# Patient Record
Sex: Female | Born: 1970 | Race: White | Hispanic: No | Smoking: Former smoker
Health system: Southern US, Community
[De-identification: ages and names within clinical notes are randomized; demographics above are authoritative.]

## PROBLEM LIST (undated history)

## (undated) DIAGNOSIS — Z9852 Vasectomy status: Secondary | ICD-10-CM

## (undated) DIAGNOSIS — F419 Anxiety disorder, unspecified: Secondary | ICD-10-CM

## (undated) HISTORY — DX: Anxiety disorder, unspecified: F41.9

## (undated) HISTORY — DX: Vasectomy status: Z98.52

---
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
## 2002-01-11 ENCOUNTER — Other Ambulatory Visit: Admission: RE | Admit: 2002-01-11 | Discharge: 2002-01-11 | Payer: Self-pay | Admitting: Gynecology

## 2002-09-21 ENCOUNTER — Other Ambulatory Visit: Admission: RE | Admit: 2002-09-21 | Discharge: 2002-09-21 | Payer: Self-pay | Admitting: Gynecology

## 2003-05-03 ENCOUNTER — Inpatient Hospital Stay (HOSPITAL_COMMUNITY): Admission: AD | Admit: 2003-05-03 | Discharge: 2003-05-04 | Payer: Self-pay | Admitting: Gynecology

## 2003-05-04 ENCOUNTER — Inpatient Hospital Stay (HOSPITAL_COMMUNITY): Admission: AD | Admit: 2003-05-04 | Discharge: 2003-05-06 | Payer: Self-pay | Admitting: Gynecology

## 2003-06-13 ENCOUNTER — Other Ambulatory Visit: Admission: RE | Admit: 2003-06-13 | Discharge: 2003-06-13 | Payer: Self-pay | Admitting: Gynecology

## 2004-06-17 ENCOUNTER — Other Ambulatory Visit: Admission: RE | Admit: 2004-06-17 | Discharge: 2004-06-17 | Payer: Self-pay | Admitting: Gynecology

## 2005-07-29 ENCOUNTER — Other Ambulatory Visit: Admission: RE | Admit: 2005-07-29 | Discharge: 2005-07-29 | Payer: Self-pay | Admitting: Gynecology

## 2006-07-30 ENCOUNTER — Other Ambulatory Visit: Admission: RE | Admit: 2006-07-30 | Discharge: 2006-07-30 | Payer: Self-pay | Admitting: Gynecology

## 2007-03-29 ENCOUNTER — Ambulatory Visit: Admission: RE | Admit: 2007-03-29 | Discharge: 2007-03-29 | Payer: Self-pay | Admitting: Obstetrics and Gynecology

## 2007-04-09 ENCOUNTER — Inpatient Hospital Stay (HOSPITAL_COMMUNITY): Admission: AD | Admit: 2007-04-09 | Discharge: 2007-04-09 | Payer: Self-pay | Admitting: Obstetrics and Gynecology

## 2007-06-13 ENCOUNTER — Inpatient Hospital Stay (HOSPITAL_COMMUNITY): Admission: RE | Admit: 2007-06-13 | Discharge: 2007-06-15 | Payer: Self-pay | Admitting: Obstetrics and Gynecology

## 2008-01-27 ENCOUNTER — Ambulatory Visit: Payer: Self-pay | Admitting: Family Medicine

## 2008-01-31 ENCOUNTER — Ambulatory Visit: Payer: Self-pay | Admitting: Family Medicine

## 2008-06-22 HISTORY — PX: AUGMENTATION MAMMAPLASTY: SUR837

## 2008-06-27 ENCOUNTER — Encounter: Payer: Self-pay | Admitting: Gynecology

## 2008-06-27 ENCOUNTER — Ambulatory Visit: Payer: Self-pay | Admitting: Gynecology

## 2008-06-27 ENCOUNTER — Other Ambulatory Visit: Admission: RE | Admit: 2008-06-27 | Discharge: 2008-06-27 | Payer: Self-pay | Admitting: Gynecology

## 2008-07-04 ENCOUNTER — Encounter: Payer: Self-pay | Admitting: Family Medicine

## 2008-09-02 ENCOUNTER — Encounter: Payer: Self-pay | Admitting: Family Medicine

## 2008-10-30 ENCOUNTER — Telehealth: Payer: Self-pay | Admitting: Family Medicine

## 2009-01-14 ENCOUNTER — Telehealth: Payer: Self-pay | Admitting: Family Medicine

## 2009-01-16 ENCOUNTER — Encounter: Payer: Self-pay | Admitting: Family Medicine

## 2009-01-28 ENCOUNTER — Ambulatory Visit: Payer: Self-pay | Admitting: Obstetrics and Gynecology

## 2009-01-28 ENCOUNTER — Inpatient Hospital Stay (HOSPITAL_COMMUNITY): Admission: AD | Admit: 2009-01-28 | Discharge: 2009-01-28 | Payer: Self-pay | Admitting: Obstetrics and Gynecology

## 2009-01-31 ENCOUNTER — Telehealth: Payer: Self-pay | Admitting: Family Medicine

## 2009-04-03 ENCOUNTER — Telehealth: Payer: Self-pay | Admitting: Family Medicine

## 2009-04-24 ENCOUNTER — Telehealth: Payer: Self-pay | Admitting: Family Medicine

## 2009-05-28 ENCOUNTER — Telehealth: Payer: Self-pay | Admitting: Family Medicine

## 2009-06-27 ENCOUNTER — Telehealth: Payer: Self-pay | Admitting: Family Medicine

## 2009-08-28 ENCOUNTER — Ambulatory Visit: Payer: Self-pay | Admitting: Gynecology

## 2009-08-28 ENCOUNTER — Other Ambulatory Visit: Admission: RE | Admit: 2009-08-28 | Discharge: 2009-08-28 | Payer: Self-pay | Admitting: Gynecology

## 2009-10-23 ENCOUNTER — Ambulatory Visit: Payer: Self-pay | Admitting: Family Medicine

## 2009-11-11 ENCOUNTER — Ambulatory Visit: Payer: Self-pay | Admitting: Gynecology

## 2009-11-19 ENCOUNTER — Ambulatory Visit: Payer: Self-pay | Admitting: Gynecology

## 2009-12-31 ENCOUNTER — Ambulatory Visit: Payer: Self-pay | Admitting: Gynecology

## 2010-02-12 ENCOUNTER — Ambulatory Visit: Payer: Self-pay | Admitting: Physician Assistant

## 2010-03-21 ENCOUNTER — Ambulatory Visit: Payer: Self-pay | Admitting: Gynecology

## 2010-04-09 ENCOUNTER — Ambulatory Visit: Payer: Self-pay | Admitting: Family Medicine

## 2010-06-09 ENCOUNTER — Ambulatory Visit: Payer: Self-pay | Admitting: Gynecology

## 2010-07-13 ENCOUNTER — Encounter: Payer: Self-pay | Admitting: Gynecology

## 2010-07-22 NOTE — Progress Notes (Signed)
  Phone Note Call from Patient   Caller: Patient Call For: Loreen Freud DO Reason for Call: Refill Medication Summary of Call: refill valium   Initial call taken by: Loreen Freud DO,  June 27, 2009 9:19 AM  Follow-up for Phone Call        ok to refill x1 pt will schedule cpe Follow-up by: Loreen Freud DO,  June 27, 2009 9:19 AM  Additional Follow-up for Phone Call Additional follow up Details #1::        [facility] [address]  Additional Follow-up by: Army Fossa CMA,  June 27, 2009 10:23 AM    Prescriptions: VALIUM 5 MG TABS (DIAZEPAM) 1 by mouth two times a day as needed  #60 x 0   Entered by:   Army Fossa CMA   Authorized by:   Loreen Freud DO   Signed by:   Army Fossa CMA on 06/27/2009   Method used:   Printed then faxed to ...       [facility]  [phone]* (retail)       [address] [place], [place]  [address]       Ph: [phone] or [phone]       Fax: [phone]   RxID:   [id number]

## 2010-09-27 LAB — CBC
HCT: 36.6 % (ref 36.0–46.0)
Hemoglobin: 12.7 g/dL (ref 12.0–15.0)
MCHC: 34.8 g/dL (ref 30.0–36.0)
MCV: 93.7 fL (ref 78.0–100.0)
Platelets: 232 10*3/uL (ref 150–400)

## 2010-09-27 LAB — URINALYSIS, ROUTINE W REFLEX MICROSCOPIC
Bilirubin Urine: NEGATIVE
Hgb urine dipstick: NEGATIVE
Ketones, ur: NEGATIVE mg/dL
Protein, ur: NEGATIVE mg/dL
Specific Gravity, Urine: 1.01 (ref 1.005–1.030)
Urobilinogen, UA: 0.2 mg/dL (ref 0.0–1.0)

## 2010-09-27 LAB — DIFFERENTIAL
Basophils Absolute: 0.1 10*3/uL (ref 0.0–0.1)
Basophils Relative: 1 % (ref 0–1)
Eosinophils Relative: 1 % (ref 0–5)
Lymphs Abs: 2.3 10*3/uL (ref 0.7–4.0)
Monocytes Absolute: 0.3 10*3/uL (ref 0.1–1.0)
Neutro Abs: 3.4 10*3/uL (ref 1.7–7.7)

## 2010-09-27 LAB — HCG, SERUM, QUALITATIVE: Preg, Serum: NEGATIVE

## 2010-11-04 NOTE — Consult Note (Signed)
NAME:  Diane Johnston, Diane Johnston NO.:  [id number]   MEDICAL RECORD NO.:  [id number]          PATIENT TYPE:  MAT   LOCATION:  MATC                          FACILITY:  [facility]   PHYSICIAN:  Daniel L. Gottsegen, M.D.DATE OF BIRTH:  12-10-1970   DATE OF CONSULTATION:  01/28/2009  DATE OF DISCHARGE:  01/28/2009                                 CONSULTATION   The patient is a 40 year old gravida 2, para 2, AB 0 who had come to the  emergency room today because of severe sudden onset of right lower  quadrant pain.  It has not been associated with change in her bowel  habits.  She is not nauseated or vomiting.  She is not having diarrhea,  but it was intense discomfort.  She contracepting with a mirena  IUD.  She has been a patient of our office for a long period of time.  The  pain was unremitting, when she got there.  I gave her some Toradol,  which has helped just a little bit so far.   PAST MEDICAL HISTORY:  The patient had 2 deliveries.   PRESENT MEDICATIONS:  Lexapro.   ALLERGIES:  She is allergic to PENICILLIN.   FAMILY HISTORY:  Her uncles with diabetic and her grandmother is  hypertensive.   SOCIAL HISTORY:  She is a drug representative.  She drinks alcohol  socially.  She is a nonsmoker.   REVIEW OF SYSTEMS:  HEENT:  Negative.  CARDIOVASCULAR:  Negative.  RESPIRATORY:  Negative.  GI:  See above.  GU:  Negative.  NEUROMUSCULAR:  Negative.   PHYSICAL EXAMINATION:  GENERAL:  The patient is a well-developed, well-  nourished female, in acute abdominal pain, lying on her side because of  the pain.  VITAL SIGNS:  Blood pressure is 130/80, pulse is 90, respiratory rate  are 16 and unlabored.  She is afebrile.  HEENT :  Within normal limits.  NECK:  Supple.  Trachea midline.  Thyroid not enlarged.  LUNGS, HEART, BREAST:  Not examined.  ABDOMEN:  She has an acute tenderness in the right lower quadrant.  There is significant guarding.  I would say there is no rebound but  there is significant guarding.  Bowel sounds are very hyperactive.  PELVIC:  External is normal.  BUS is normal.  Vaginal is normal.  Cervix  is clean.  IUD string is visible.  Uterus is normal size and shape.  Adnexa reveals some tenderness, but the tenderness almost appears to be  the lateral to the right adnexa.  Rectovaginal is confirmatory.   IMPRESSION:  Right lower quadrant pain of sudden onset of unknown  origin.   PLAN:  CBC, quantitative hCG, urine, pelvic ultrasound.   FINDINGS:  All studies came back normal, including her ultrasound.  During the time that she was here, getting her lab work done, and having  her ultrasound done, her pain absolutely diminished tremendously from of  10 to about 2.  I reexamined 3 hours later.  Her abdomen now is much  less tender.  I do not believe, we should proceed with a CT  scan at the moment.  She knows to come back immediately for CT scan of  her abdomen and pelvis to rule out appendicitis, if her pain starts to  get worse again.  She will call me in the morning to tell me, how she is  doing.  She will take ibuprofen as needed for pain at home.      Daniel L. Eda Paschal, M.D.  Electronically Signed     DLG/MEDQ  D:  01/28/2009  T:  01/29/2009  Job:  [id number]

## 2010-11-04 NOTE — Discharge Summary (Signed)
NAME:  Diane Johnston, Diane Johnston NO.:  [id number]   MEDICAL RECORD NO.:  [id number]          PATIENT TYPE:  INP   LOCATION:  9110                          [facility]ACILITY:  [facility]   PHYSICIAN:  Gerrit Friends. Aldona Bar, M.D.   DATE OF BIRTH:  [birth year]   DATE OF ADMISSION:  06/13/2007  DATE OF DISCHARGE:  06/15/2007                               DISCHARGE SUMMARY   DISCHARGE DIAGNOSES:  1. Term pregnancy delivered 7 pounds 4 ounces female infant, Apgars 9      and 9.  2. Blood type O negative - RhoGAM not needed.   PROCEDURES:  1. Induction of labor.  2. Normal spontaneous delivery.  3. Midline episiotomy and repair.   SUMMARY:  This 41 year old gravida 2, para 1 was admitted at [redacted] weeks  gestation for induction of labor at term by Dr. Henderson Cloud.  She was  admitted on the morning of 12/22 and progressed and subsequently had a  normal spontaneous delivery of a viable female infant weighing 7 pounds 4  ounces with Apgars of 8 and 9 over a second-degree episiotomy which was  repaired without difficulty.  The mother's postpartum course was benign.  Circumcision was done on the first postpartum day.  Discharge hemoglobin  was 10.8 with a white count of 14,100 and a platelet count of 188,000.  On the morning of 12/24, the patient was ambulating, tolerating a  regular diet well, was having normal bowel and bladder function, was  afebrile, was bottle feeding without difficulty (instructed), and was  anxious for discharge.  Accordingly, she was given all appropriate  instructions and understood all instructions well.  Discharge  medications include vitamins - one a day until gone, ferrous sulfate 325  mg one daily or every other day, Motrin 600 mg every 6 hours as needed  for cramping or pain, and Tylox 1-2 every 4-6 hours as needed for more  severe pain.  She will return to the office for followup in  approximately four weeks' time or as needed.   CONDITION ON DISCHARGE:  Improved.      Gerrit Friends. Aldona Bar, M.D.  Electronically Signed     RMW/MEDQ  D:  06/15/2007  T:  06/15/2007  Job:  [id number]

## 2010-11-07 NOTE — Discharge Summary (Signed)
NAME:  [name], Diane Johnston NO.:  [id number]   MEDICAL RECORD NO.:  [id number]                   PATIENT TYPE:  INP   LOCATION:  9123                                 [facility]ILITY[facility]   PHYSICIAN:  Ivor Costa. Farrel Gobble, M.D.              DATE OF BIRTH:  [birth year]   DATE OF ADMISSION:  05/04/2003  DATE OF DISCHARGE:  05/06/2003                                 DISCHARGE SUMMARY   DISCHARGE DIAGNOSES:  1. Intrauterine pregnancy 37 weeks, delivered.  2. 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  3. Status post spontaneous vaginal delivery.  4. Rh negative.   HISTORY:  This is a 40 year old female gravida 1, para 0 with an EDC of  May 23, 2003.  Prenatal course was complicated being Rh negative.  The  patient received RhoGAM in pregnancy.  Also was found to be a cystic  fibrosis carrier; however, patient was negative.   HOSPITAL COURSE:  On May 04, 2003 patient was admitted in labor at 37  weeks and subsequently underwent a spontaneous vaginal delivery on May 04, 2003 of a female, Apgars of 9 and 9, weight of 5 pounds 13 ounces.  There were no complications.  There was a first degree which was repaired.  Postpartum patient remained afebrile, voiding, stable condition.  She was  discharged to home May 06, 2003 and given [facility]  postpartum instruction.   ACCESSORY CLINICAL FINDINGS:  The patient is O-.  Rubella immune.  On  May 05, 2003 hemoglobin 12.1.   DISPOSITION:  The patient is discharged to home.  Informed to return six  weeks.  Any problem prior to that time to be seen in office.  Of note the  baby is also Rh negative.  Given prescription for Tylox p.r.n. pain #30.     Susa Loffler, P.A.                    Ivor Costa. Farrel Gobble, M.D.    TSG/MEDQ  D:  05/28/2003  T:  05/28/2003  Job:  [id number]

## 2010-11-07 NOTE — H&P (Signed)
NAME:  Diane Johnston, Diane Johnston NO.:  [id number]   MEDICAL RECORD NO.:  [id number]                   PATIENT TYPE:  INP   LOCATION:  9162                                 [facility]   PHYSICIAN:  Juan H. Lily Peer, M.D.             DATE OF BIRTH:  1971-03-10   DATE OF ADMISSION:  05/04/2003  DATE OF DISCHARGE:                                HISTORY & PHYSICAL   CHIEF COMPLAINT:  Contractions.   HISTORY OF PRESENT ILLNESS:  The patient is a 40 year old gravida 1, para 0  at 38 weeks estimated gestational age, with estimated date of confinement on  May 23, 2003.  She presented to [facility] today for the second  time in labor.  Earlier today she was in early labor and cervix was 1 cm and  80% with minus 3; reassuring tracing.  She was sent home with Ambien to  return back.  Her cervix is now 3 cm, 90%  minus 2.  The patient is in a lot  of discomfort due to her labor, with contractions every 3-4 min apart with a  reassuring heart rate tracing.   PRENATAL COURSE:  The patient is Rh negative, received RhoGAM.  Her husband  is Rh positive.  She is also a cystic fibrosis carrier and her husband was  tested and was negative.  The remainder of her prenatal course essentially  had been unremarkable (see [place] form).   PAST MEDICAL HISTORY:  1. The patient is a carrier for cystic fibrosis.  2. She has had a tonsillectomy at the age of [age].   ALLERGIES:  PENICILLIN.   REVIEW OF SYSTEMS:  See [place] form.   PHYSICAL EXAMINATION:  GENERAL:  Well developed, well nourished female.  HEENT:  Unremarkable.  NECK:  Supple.  Trachea midline.  No carotid bruits.  No thyromegaly.  LUNGS:  Lungs clear to auscultation without rhonchi or wheezes.  HEART:  Regular rate and rhythm; no murmurs or gallops.  BREASTS:  Examination not done.  ABDOMEN:  Gravid uterus.  Vertex presentation by [facility] maneuver.  Positive  fetal heart tones.  PELVIC:  Cervix 3+ cm,  90%, minus 2 station.  Intact membranes.  EXTREMITIES:  Deep tendon 1+.  Negative clonus.  Trace edema.   PRENATAL LABORATORIES:  Blood type 0 negative.  Negative antibody screen.  VDRL was nonreactive.  Hepatitis B surface antigen and HIV nonreactive.  Rubella immune.  Alpha-fetoprotein normal.  GBS culture negative.  Diabetes  screen normal.   ASSESSMENT:  A 40 year old gravida 1, para 0 at 37-1/2 weeks estimated  gestational age; in labor and advanced cervical dilatation.  Reassuring  fetal heart rate tracing.  Cystic fibrosis carrier.  Her husband tested,  negative carrier status.  Patient with negative GBS culture.  Contracting  every 3-4 min apart, with a reassuring fetal heart rate tracing.  She will  be admitted to labor and  delivery.  In Triage she will receive  1 mg Stadol with 12.5 mg Phenergan for her discomfort.  Once she is on the  Floor she will have her epidural, along with the routine labs.   PLAN:  As per assessment above.  Anticipate vaginal delivery.                                               Juan H. Lily Peer, M.D.    JHF/MEDQ  D:  05/04/2003  T:  05/04/2003  Job:  [id number]

## 2010-11-14 ENCOUNTER — Other Ambulatory Visit: Payer: Self-pay | Admitting: Family Medicine

## 2010-11-24 MED ORDER — ESCITALOPRAM OXALATE 10 MG PO TABS: 10.0000 mg | ORAL_TABLET | Freq: Every day | ORAL | Status: DC

## 2010-11-24 NOTE — Telephone Encounter (Signed)
She will need an appointment. I gave her a one-month supply.

## 2010-11-24 NOTE — Telephone Encounter (Deleted)
Have you done this?

## 2010-11-24 NOTE — Telephone Encounter (Deleted)
Hey

## 2010-11-24 NOTE — Telephone Encounter (Deleted)
See prior mess

## 2010-12-23 ENCOUNTER — Other Ambulatory Visit: Payer: Self-pay | Admitting: Gynecology

## 2010-12-23 DIAGNOSIS — Z1231 Encounter for screening mammogram for malignant neoplasm of breast: Secondary | ICD-10-CM

## 2010-12-29 ENCOUNTER — Encounter: Payer: Self-pay | Admitting: Gynecology

## 2010-12-31 ENCOUNTER — Ambulatory Visit: Payer: Self-pay

## 2011-01-06 ENCOUNTER — Other Ambulatory Visit: Payer: Self-pay | Admitting: Gynecology

## 2011-01-06 ENCOUNTER — Encounter (INDEPENDENT_AMBULATORY_CARE_PROVIDER_SITE_OTHER): Payer: BC Managed Care – PPO | Admitting: Gynecology

## 2011-01-06 ENCOUNTER — Other Ambulatory Visit (HOSPITAL_COMMUNITY)
Admission: RE | Admit: 2011-01-06 | Discharge: 2011-01-06 | Disposition: A | Discharge status: Home / Self Care | Payer: BC Managed Care – PPO | Source: Ambulatory Visit | Attending: Gynecology | Admitting: Gynecology

## 2011-01-06 DIAGNOSIS — B373 Candidiasis of vulva and vagina: Secondary | ICD-10-CM

## 2011-01-06 DIAGNOSIS — Z01419 Encounter for gynecological examination (general) (routine) without abnormal findings: Secondary | ICD-10-CM

## 2011-01-06 DIAGNOSIS — Z124 Encounter for screening for malignant neoplasm of cervix: Secondary | ICD-10-CM | POA: Insufficient documentation

## 2011-01-06 DIAGNOSIS — R634 Abnormal weight loss: Secondary | ICD-10-CM

## 2011-03-27 LAB — CBC
HCT: 34 — ABNORMAL LOW
Hemoglobin: 10.8 — ABNORMAL LOW
Hemoglobin: 11.8 — ABNORMAL LOW
MCHC: 35
MCV: 92.1
RBC: 3.35 — ABNORMAL LOW
RDW: 13.6
WBC: 9.1

## 2011-04-01 LAB — URINALYSIS, ROUTINE W REFLEX MICROSCOPIC
Bilirubin Urine: NEGATIVE
Hgb urine dipstick: NEGATIVE
Specific Gravity, Urine: <1.005 — ABNORMAL LOW
pH: 6

## 2011-04-02 LAB — RH IMMUNE GLOBULIN WORKUP (NOT WOMEN'S HOSP): Antibody Screen: NEGATIVE

## 2011-04-30 ENCOUNTER — Ambulatory Visit: Payer: BC Managed Care – PPO

## 2011-05-27 ENCOUNTER — Ambulatory Visit
Admission: RE | Admit: 2011-05-27 | Discharge: 2011-05-27 | Disposition: A | Discharge status: Home / Self Care | Payer: BC Managed Care – PPO | Source: Ambulatory Visit | Attending: Gynecology | Admitting: Gynecology

## 2011-05-27 DIAGNOSIS — Z1231 Encounter for screening mammogram for malignant neoplasm of breast: Secondary | ICD-10-CM

## 2011-10-12 ENCOUNTER — Other Ambulatory Visit: Payer: Self-pay | Admitting: *Deleted

## 2011-10-12 MED ORDER — ALPRAZOLAM 0.25 MG PO TABS: 0.2500 mg | ORAL_TABLET | Freq: Every evening | ORAL | Status: AC | PRN

## 2011-10-12 NOTE — Telephone Encounter (Signed)
rx called in

## 2011-11-13 ENCOUNTER — Other Ambulatory Visit: Payer: Self-pay | Admitting: *Deleted

## 2011-11-13 MED ORDER — ALPRAZOLAM 0.25 MG PO TABS: 0.2500 mg | ORAL_TABLET | Freq: Every evening | ORAL | Status: DC | PRN

## 2012-01-11 ENCOUNTER — Other Ambulatory Visit: Payer: Self-pay | Admitting: Women's Health

## 2012-01-11 MED ORDER — ALPRAZOLAM 0.25 MG PO TABS: 0.2500 mg | ORAL_TABLET | Freq: Every evening | ORAL | Status: DC | PRN

## 2012-01-12 NOTE — Telephone Encounter (Signed)
Called into pharmacy

## 2012-02-04 ENCOUNTER — Other Ambulatory Visit: Payer: Self-pay | Admitting: Women's Health

## 2012-02-04 NOTE — Telephone Encounter (Signed)
Yes, please refill Lexapro prescription. thank you for having her schedule annual exam.

## 2012-02-04 NOTE — Telephone Encounter (Signed)
Diane Johnston, patient's last CE was 01/07/11. Lupita Leash is going to call her to schedule CE for her.  Do you want to refill her Rx above?  Hardcopy chart is on your desk.

## 2012-03-31 ENCOUNTER — Other Ambulatory Visit: Payer: Self-pay | Admitting: Women's Health

## 2012-03-31 NOTE — Telephone Encounter (Signed)
Has AEX with JF on 04/07/12.

## 2012-03-31 NOTE — Telephone Encounter (Signed)
Please call patient and have her schedule annual exam with Dr. Lily Peer, last exam was July 2012. Okay for  Rx with no other refills

## 2012-04-07 ENCOUNTER — Encounter: Payer: BC Managed Care – PPO | Admitting: Gynecology

## 2012-04-12 ENCOUNTER — Encounter: Payer: BC Managed Care – PPO | Admitting: Gynecology

## 2012-04-18 ENCOUNTER — Encounter: Payer: Self-pay | Admitting: Gynecology

## 2012-04-18 ENCOUNTER — Ambulatory Visit (INDEPENDENT_AMBULATORY_CARE_PROVIDER_SITE_OTHER): Payer: BC Managed Care – PPO | Admitting: Gynecology

## 2012-04-18 VITALS — BP 110/68 | Ht 65.75 in | Wt 116.0 lb

## 2012-04-18 DIAGNOSIS — Z23 Encounter for immunization: Secondary | ICD-10-CM

## 2012-04-18 DIAGNOSIS — Z01419 Encounter for gynecological examination (general) (routine) without abnormal findings: Secondary | ICD-10-CM

## 2012-04-18 DIAGNOSIS — A6000 Herpesviral infection of urogenital system, unspecified: Secondary | ICD-10-CM

## 2012-04-18 LAB — CBC WITH DIFFERENTIAL/PLATELET
Basophils Absolute: 0 10*3/uL (ref 0.0–0.1)
Basophils Relative: 1 % (ref 0–1)
Eosinophils Absolute: 0.1 10*3/uL (ref 0.0–0.7)
HCT: 37.3 % (ref 36.0–46.0)
MCHC: 34.3 g/dL (ref 30.0–36.0)
Monocytes Absolute: 0.4 10*3/uL (ref 0.1–1.0)
Neutro Abs: 3.6 10*3/uL (ref 1.7–7.7)
Neutrophils Relative %: 62 % (ref 43–77)
RDW: 13.1 % (ref 11.5–15.5)

## 2012-04-18 LAB — GLUCOSE, RANDOM: Glucose, Bld: 78 mg/dL (ref 70–99)

## 2012-04-18 NOTE — Progress Notes (Signed)
Diane Johnston 1970/10/26 [id number]   History:    41 y.o.  for annual gyn exam with no complaints today. Patient is having normal menstrual cycle. Patient no longer taking Lexapro. Patient's husband has had a vasectomy. Patient does her monthly self breast examination. Her last mammogram was in 2012 was normal she has one scheduled the next few weeks. She does not recall and there had received a dTap Vaccine.  Past medical history,surgical history, family history and social history were all reviewed and documented in the EPIC chart.  Gynecologic History Patient's last menstrual period was 04/04/2012. Contraception: vasectomy Last Pap: 2012. Results were: normal Last mammogram: 2012. Results were: normal  Obstetric History OB History    Grav Para Term Preterm Abortions TAB SAB Ect Mult Living   2 2 2       2      # Outc Date GA Lbr Len/2nd Wgt Sex Del Anes PTL Lv   1 TRM     F SVD  No Yes   2 TRM     M SVD  No Yes       ROS: A ROS was performed and pertinent positives and negatives are included in the history.  GENERAL: No fevers or chills. HEENT: No change in vision, no earache, sore throat or sinus congestion. NECK: No pain or stiffness. CARDIOVASCULAR: No chest pain or pressure. No palpitations. PULMONARY: No shortness of breath, cough or wheeze. GASTROINTESTINAL: No abdominal pain, nausea, vomiting or diarrhea, melena or bright red blood per rectum. GENITOURINARY: No urinary frequency, urgency, hesitancy or dysuria. MUSCULOSKELETAL: No joint or muscle pain, no back pain, no recent trauma. DERMATOLOGIC: No rash, no itching, no lesions. ENDOCRINE: No polyuria, polydipsia, no heat or cold intolerance. No recent change in weight. HEMATOLOGICAL: No anemia or easy bruising or bleeding. NEUROLOGIC: No headache, seizures, numbness, tingling or weakness. PSYCHIATRIC: No depression, no loss of interest in normal activity or change in sleep pattern.     Exam: chaperone present  BP 110/68  Ht 5'  5.75" (1.67 m)  Wt 116 lb (52.617 kg)  BMI 18.87 kg/m2  LMP 04/04/2012  Body mass index is 18.87 kg/(m^2).  General appearance : Well developed well nourished female. No acute distress HEENT: Neck supple, trachea midline, no carotid bruits, no thyroidmegaly Lungs: Clear to auscultation, no rhonchi or wheezes, or rib retractions  Heart: Regular rate and rhythm, no murmurs or gallops Breast:Examined in sitting and supine position were symmetrical in appearance, no palpable masses or tenderness,  no skin retraction, no nipple inversion, no nipple discharge, no skin discoloration, no axillary or supraclavicular lymphadenopathy Abdomen: no palpable masses or tenderness, no rebound or guarding Extremities: no edema or skin discoloration or tenderness  Pelvic:  Bartholin, Urethra, Skene Glands: Within normal limits             Vagina: No gross lesions or discharge  Cervix: No gross lesions or discharge  Uterus  anteverted, normal size, shape and consistency, non-tender and mobile  Adnexa  Without masses or tenderness  Anus and perineum  normal   Rectovaginal  normal sphincter tone without palpated masses or tenderness             Hemoccult not done     Assessment/Plan:  41 y.o. female for annual exam who was to receive her dTap Vaccine today. The following labs were ordered today: Fasting lipid profile, fasting blood sugar, TSH, CBC and urinalysis. We discussed the new Pap smear screening guidelines and no Pap smear done  today. She was encouraged to do her monthly self breast examination and to followup with her mammogram.    Ok Edwards MD, 12:47 PM 04/18/2012

## 2012-04-18 NOTE — Addendum Note (Signed)
Addended by: Bertram Savin A on: 04/18/2012 12:53 PM   Modules accepted: Orders

## 2012-04-18 NOTE — Patient Instructions (Addendum)

## 2012-04-19 ENCOUNTER — Other Ambulatory Visit: Payer: Self-pay | Admitting: Gynecology

## 2012-04-19 DIAGNOSIS — E7800 Pure hypercholesterolemia, unspecified: Secondary | ICD-10-CM

## 2012-04-19 LAB — URINALYSIS W MICROSCOPIC + REFLEX CULTURE
Bacteria, UA: NONE SEEN
Bilirubin Urine: NEGATIVE
Casts: NONE SEEN
Crystals: NONE SEEN
Glucose, UA: NEGATIVE mg/dL
Hgb urine dipstick: NEGATIVE
Ketones, ur: NEGATIVE mg/dL
Leukocytes, UA: NEGATIVE
Nitrite: NEGATIVE
Protein, ur: NEGATIVE mg/dL
Specific Gravity, Urine: 1.007 (ref 1.005–1.030)
Urobilinogen, UA: 0.2 mg/dL (ref 0.0–1.0)
pH: 7.5 (ref 5.0–8.0)

## 2012-05-01 ENCOUNTER — Other Ambulatory Visit: Payer: Self-pay | Admitting: Women's Health

## 2012-05-02 NOTE — Telephone Encounter (Signed)
Pt just had AEX with Dr Lily Peer.  [place] said OK to the prescribed but to let Dr Glenetta Hew know.

## 2012-06-25 ENCOUNTER — Emergency Department (HOSPITAL_COMMUNITY): Payer: BC Managed Care – PPO

## 2012-06-25 ENCOUNTER — Emergency Department (HOSPITAL_COMMUNITY)
Admission: EM | Admit: 2012-06-25 | Discharge: 2012-06-25 | Disposition: A | Discharge status: Home / Self Care | Payer: BC Managed Care – PPO | Attending: Emergency Medicine | Admitting: Emergency Medicine

## 2012-06-25 ENCOUNTER — Encounter (HOSPITAL_COMMUNITY): Payer: Self-pay | Admitting: Emergency Medicine

## 2012-06-25 ENCOUNTER — Other Ambulatory Visit: Payer: Self-pay | Admitting: Gynecology

## 2012-06-25 DIAGNOSIS — R55 Syncope and collapse: Secondary | ICD-10-CM | POA: Insufficient documentation

## 2012-06-25 DIAGNOSIS — Z8619 Personal history of other infectious and parasitic diseases: Secondary | ICD-10-CM | POA: Insufficient documentation

## 2012-06-25 DIAGNOSIS — R5381 Other malaise: Secondary | ICD-10-CM | POA: Insufficient documentation

## 2012-06-25 DIAGNOSIS — F17200 Nicotine dependence, unspecified, uncomplicated: Secondary | ICD-10-CM | POA: Insufficient documentation

## 2012-06-25 DIAGNOSIS — Z79899 Other long term (current) drug therapy: Secondary | ICD-10-CM | POA: Insufficient documentation

## 2012-06-25 DIAGNOSIS — F411 Generalized anxiety disorder: Secondary | ICD-10-CM | POA: Insufficient documentation

## 2012-06-25 DIAGNOSIS — I951 Orthostatic hypotension: Secondary | ICD-10-CM | POA: Insufficient documentation

## 2012-06-25 LAB — COMPREHENSIVE METABOLIC PANEL
ALT: 8 U/L (ref 0–35)
AST: 17 U/L (ref 0–37)
Albumin: 3.8 g/dL (ref 3.5–5.2)
Alkaline Phosphatase: 53 U/L (ref 39–117)
Glucose, Bld: 83 mg/dL (ref 70–99)
Potassium: 4 mEq/L (ref 3.5–5.1)
Sodium: 136 mEq/L (ref 135–145)
Total Protein: 6.4 g/dL (ref 6.0–8.3)

## 2012-06-25 LAB — CBC WITH DIFFERENTIAL/PLATELET
Basophils Absolute: 0 10*3/uL (ref 0.0–0.1)
Basophils Relative: 0 % (ref 0–1)
Eosinophils Absolute: 0.2 10*3/uL (ref 0.0–0.7)
Lymphs Abs: 2 10*3/uL (ref 0.7–4.0)
MCH: 30.9 pg (ref 26.0–34.0)
Neutrophils Relative %: 73 % (ref 43–77)
Platelets: 327 10*3/uL (ref 150–400)
RBC: 4.04 MIL/uL (ref 3.87–5.11)
RDW: 12.6 % (ref 11.5–15.5)

## 2012-06-25 MED ORDER — SODIUM CHLORIDE 0.9 % IV SOLN: Freq: Once | INTRAVENOUS | Status: DC

## 2012-06-25 NOTE — ED Notes (Signed)
Reports noted while driving & not driving feels like she is zoning out, & going to pass out, hear ringing in ears when tunnel vision occurs. This has been going on since November worse this pass week. Coming from family MD for a CT scan & thyroid work-up.  General appearance is a well no acute distress patient who has no neuro deficits, embraced of her pedicure.

## 2012-06-25 NOTE — ED Notes (Signed)
Patient c/o generalized weakness, dizziness, and near syncopal episodes while driving.  Patient has h/o orthostatic hypotension.  Patient was seen at an Urgent Care and sent here for follow up.

## 2012-06-25 NOTE — ED Provider Notes (Signed)
History     CSN: [id number]  Arrival date & time 06/25/12  1247   First MD Initiated Contact with Patient 06/25/12 1320      Chief Complaint  Patient presents with  . Fatigue  . Near Syncope    (Consider location/radiation/quality/duration/timing/severity/associated sxs/prior treatment) HPI Comments: The patient presents with complaints of weakness for the past several weeks.  She feels faint and light-headed on occasion as if she is going to pass out.  This has occurred while driving and while at rest at home.  She denies any chest pain or shortness of breath.  No n/v/d.  She was told at one time that she had orthostatic hypotension.    The history is provided by the patient.    Past Medical History  Diagnosis Date  . Anxiety   . HSV-2 (herpes simplex virus 2) infection   . STD (sexually transmitted disease)     HSV 2  . H/O: vasectomy     HUSBAND HAS HAD VASECTOMY    Past Surgical History  Procedure Date  . Tonsillectomy and adenoidectomy 1980  . Augmentation mammaplasty 2010    SILICONE    Family History  Problem Relation Age of Onset  . Osteoporosis Maternal Grandmother   . Hypertension Paternal Grandmother   . Cancer Paternal Grandmother     OVARIAN CANCER    History  Substance Use Topics  . Smoking status: Current Some Day Smoker    Types: Cigarettes  . Smokeless tobacco: Never Used  . Alcohol Use: Yes    OB History    Grav Para Term Preterm Abortions TAB SAB Ect Mult Living   2 2 2       2       Review of Systems  All other systems reviewed and are negative.    Allergies  Penicillins  Home Medications   Current Outpatient Rx  Name  Route  Sig  Dispense  Refill  . ALPRAZOLAM 0.25 MG PO TABS   Oral   Take 0.25 mg by mouth at bedtime as needed. Anxiety         . IBUPROFEN 200 MG PO TABS   Oral   Take 200 mg by mouth every 6 (six) hours as needed. Pain         . MULTIVITAMINS PO CAPS   Oral   Take 1 capsule by mouth daily.             BP 109/94  Pulse 80  Temp 98 F (36.7 C) (Oral)  Resp 20  Ht 5\' 6"  (1.676 m)  Wt 120 lb (54.432 kg)  BMI 19.37 kg/m2  SpO2 100%  LMP 06/22/2012  Physical Exam  Nursing note and vitals reviewed. Constitutional: She is oriented to person, place, and time. She appears well-developed and well-nourished. No distress.  HENT:  Head: Normocephalic and atraumatic.  Mouth/Throat: Oropharynx is clear and moist.  Eyes: EOM are normal. Pupils are equal, round, and reactive to light.  Neck: Normal range of motion. Neck supple.  Cardiovascular: Normal rate and regular rhythm.  Exam reveals no gallop and no friction rub.   No murmur heard. Pulmonary/Chest: Effort normal and breath sounds normal. No respiratory distress. She has no wheezes.  Abdominal: Soft. Bowel sounds are normal. She exhibits no distension. There is no tenderness.  Musculoskeletal: Normal range of motion.  Neurological: She is alert and oriented to person, place, and time. No cranial nerve deficit. She exhibits normal muscle tone. Coordination normal.  Skin: Skin  is warm and dry. She is not diaphoretic.    ED Course  Procedures (including critical care time)   Labs Reviewed  CBC WITH DIFFERENTIAL  COMPREHENSIVE METABOLIC PANEL  TROPONIN I  TSH   Ct Head Wo Contrast  06/25/2012  *RADIOLOGY REPORT*  Clinical Data:  Near syncope  CT HEAD WITHOUT CONTRAST  Technique:  Contiguous axial images were obtained from the base of the skull through the vertex without contrast  Comparison:  None.  Findings:  The brain has a normal appearance without evidence for hemorrhage, acute infarction, hydrocephalus, or mass lesion.  There is no extra axial fluid collection.  The skull and paranasal sinuses are normal.  IMPRESSION: Normal CT of the head without contrast.   Original Report Authenticated By: Janeece Riggers, M.D.      No diagnosis found.   Date: 06/25/2012  Rate: 57  Rhythm: sinus bradycardia  QRS Axis: normal   Intervals: normal  ST/T Wave abnormalities: normal  Conduction Disutrbances:none  Narrative Interpretation:   Old EKG Reviewed: none available    MDM  The patient presents with episodic episodes of near-syncope/dizziness that have been occurring for weeks.  She was seen at [facility] and sent here for further workup.  The labs, ekg, and ct of the head are all unremarkable.  At this point I do not have an explanation for these symptoms.  There does not appear to be an emergent cause and she appears very stable for discharge.  She will be discharged, to return prn for any problems.  I will also encourage follow up with her primary provider.        Geoffery Lyons, MD 06/25/12 [phone]

## 2012-06-27 NOTE — Telephone Encounter (Signed)
rx called in JW

## 2012-09-07 ENCOUNTER — Emergency Department (HOSPITAL_COMMUNITY)
Admission: EM | Admit: 2012-09-07 | Discharge: 2012-09-07 | Discharge status: Against Medical Advice | Payer: BC Managed Care – PPO | Attending: Emergency Medicine | Admitting: Emergency Medicine

## 2012-09-07 ENCOUNTER — Encounter (HOSPITAL_COMMUNITY): Payer: Self-pay | Admitting: *Deleted

## 2012-09-07 DIAGNOSIS — Z87891 Personal history of nicotine dependence: Secondary | ICD-10-CM | POA: Insufficient documentation

## 2012-09-07 DIAGNOSIS — F411 Generalized anxiety disorder: Secondary | ICD-10-CM | POA: Insufficient documentation

## 2012-09-07 LAB — POCT I-STAT TROPONIN I: Troponin i, poc: 0.01 ng/mL (ref 0.00–0.08)

## 2012-09-07 NOTE — ED Notes (Signed)
Called x 3 in lobby, no answer 

## 2012-09-07 NOTE — ED Notes (Signed)
Pt is under a lot of stress at home/work and feels like her body is "shutting down".  She feels as if her heart is racing (hr 93), like her throat is tight.  She feels as if she is having a heart attack.

## 2012-09-28 ENCOUNTER — Telehealth: Payer: Self-pay | Admitting: *Deleted

## 2012-09-28 NOTE — Telephone Encounter (Signed)
Pt currently taking xanax 0.25 mg , pt called requesting if she could have her xanax refill at earlier date.[facility] said that [facility] was [facility] filled on 09/08/12. Pt just received a new job promotion and has had stress, she will be leaving town today and asked if this could be done? Please advise

## 2012-09-28 NOTE — Telephone Encounter (Signed)
Robin (pharmacist) said that the patient does have a refill left, her last refill was on March 30 th, the patient would like a early refill because of leaving town. She did receive # 30 pill on the march 20 th rx.  The pharmacy will need your approval if its okay to fill the xanax early. Please advise

## 2012-09-28 NOTE — Telephone Encounter (Signed)
Please call in for 30 tablets since she is going on a trip. But will need to monitor

## 2012-09-28 NOTE — Telephone Encounter (Signed)
Spoke with [facility] at [facility] [address] and told him per JF okay to fill xanax 0.25 mg # 30.

## 2012-09-28 NOTE — Telephone Encounter (Signed)
Please clarify if she had a refill on March 20th were there no refills? She should have gotten 30?

## 2012-10-24 ENCOUNTER — Telehealth: Payer: Self-pay | Admitting: Gynecology

## 2012-10-24 ENCOUNTER — Other Ambulatory Visit: Payer: Self-pay | Admitting: Gynecology

## 2012-10-24 DIAGNOSIS — Z3049 Encounter for surveillance of other contraceptives: Secondary | ICD-10-CM

## 2012-10-24 MED ORDER — LEVONORGESTREL 20 MCG/24HR IU IUD: INTRAUTERINE_SYSTEM | Freq: Once | INTRAUTERINE | Status: DC

## 2012-10-24 NOTE — Telephone Encounter (Signed)
Pt was advised today that her [employer] ins covers the Mirena & insertion at 100%. She made appt with JF for 10/31/12.WL

## 2012-10-31 ENCOUNTER — Ambulatory Visit (INDEPENDENT_AMBULATORY_CARE_PROVIDER_SITE_OTHER): Payer: BC Managed Care – PPO | Admitting: Gynecology

## 2012-10-31 ENCOUNTER — Encounter: Payer: Self-pay | Admitting: Gynecology

## 2012-10-31 VITALS — BP 112/70

## 2012-10-31 DIAGNOSIS — Z30430 Encounter for insertion of intrauterine contraceptive device: Secondary | ICD-10-CM | POA: Insufficient documentation

## 2012-10-31 NOTE — Progress Notes (Signed)
Patient is a 42 year old who was seen the office on October 2013 for annual gynecological examination. Patient's husband has had vasectomy and several years ago she discontinued a Mirena IUD. She had done well with a Mirena IUD for cycle control is here to have the Mirena IUD placed as a result of her heavy periods. Patient did well with a Mirena IUD in the past. Literature formation been provided as well. Patient fully where the risks benefits and pros and cons. Patient fully where this form of contraception is 99% effective and is good for 5 years.                                   IUD procedure note       Patient presented to the office today for placement of Mirena IUD. The patient had previously been provided with literature information on this method of contraception. The risks benefits and pros and cons were discussed and all her questions were answered. She is fully aware that this form of contraception is 99% effective and is good for 5 years.  Pelvic exam: Bartholin urethra Skene glands: Within normal limits Vagina: No lesions or discharge Cervix: No lesions or discharge Uterus: anteverted position Adnexa: No masses or tenderness Rectal exam: Not done  The cervix was cleansed with Betadine solution. A single-tooth tenaculum was placed on the anterior cervical lip. The uterus sounded to 7-1/2 centimeter. The IUD was shown to the patient and inserted in a sterile fashion. The IUD string was trimmed. The single-tooth tenaculum was removed. Patient was instructed to return back to the office in one month for follow up.

## 2012-10-31 NOTE — Patient Instructions (Addendum)
Intrauterine Device Information  An intrauterine device (IUD) is inserted into your uterus and prevents pregnancy. There are 2 types of IUDs available:  · Copper IUD. This type of IUD is wrapped in copper wire and is placed inside the uterus. Copper makes the uterus and fallopian tubes produce a fluid that kills sperm. The copper IUD can stay in place for 10 years.  · Hormone IUD. This type of IUD contains the hormone progestin (synthetic progesterone). The hormone thickens the cervical mucus and prevents sperm from entering the uterus, and it also thins the uterine lining to prevent implantation of a fertilized egg. The hormone can weaken or kill the sperm that get into the uterus. The hormone IUD can stay in place for 5 years.  Your caregiver will make sure you are a good candidate for a contraceptive IUD. Discuss with your caregiver the possible side effects.  ADVANTAGES  · It is highly effective, reversible, long-acting, and low maintenance.  · There are no estrogen-related side effects.  · An IUD can be used when breastfeeding.  · It is not associated with weight gain.  · It works immediately after insertion.  · The copper IUD does not interfere with your female hormones.  · The progesterone IUD can make heavy menstrual periods lighter.  · The progesterone IUD can be used for 5 years.  · The copper IUD can be used for 10 years.  DISADVANTAGES  · The progesterone IUD can be associated with irregular bleeding patterns.  · The copper IUD can make your menstrual flow heavier and more painful.  · You may experience cramping and vaginal bleeding after insertion.  Document Released: 05/12/2004 Document Revised: 08/31/2011 Document Reviewed: 10/11/2010  Exit[employer]® Patient Information ©2013 [employer].

## 2013-03-27 ENCOUNTER — Encounter: Payer: BC Managed Care – PPO | Admitting: Gynecology

## 2013-10-30 ENCOUNTER — Other Ambulatory Visit: Payer: Self-pay | Admitting: Family Medicine

## 2013-10-30 DIAGNOSIS — F411 Generalized anxiety disorder: Secondary | ICD-10-CM

## 2013-10-30 MED ORDER — ALPRAZOLAM 0.25 MG PO TABS: 0.2500 mg | ORAL_TABLET | Freq: Three times a day (TID) | ORAL | Status: DC | PRN

## 2013-12-14 ENCOUNTER — Other Ambulatory Visit: Payer: Self-pay | Admitting: Family Medicine

## 2013-12-14 DIAGNOSIS — F4323 Adjustment disorder with mixed anxiety and depressed mood: Secondary | ICD-10-CM

## 2013-12-14 MED ORDER — ESCITALOPRAM OXALATE 10 MG PO TABS: 10.0000 mg | ORAL_TABLET | Freq: Every day | ORAL | Status: DC

## 2014-04-23 ENCOUNTER — Encounter: Payer: Self-pay | Admitting: Gynecology

## 2014-06-18 ENCOUNTER — Other Ambulatory Visit: Payer: Self-pay

## 2014-06-18 ENCOUNTER — Other Ambulatory Visit: Payer: Self-pay | Admitting: Family Medicine

## 2014-06-18 DIAGNOSIS — F4323 Adjustment disorder with mixed anxiety and depressed mood: Secondary | ICD-10-CM

## 2014-06-18 MED ORDER — ESCITALOPRAM OXALATE 10 MG PO TABS: 10.0000 mg | ORAL_TABLET | Freq: Every day | ORAL | Status: DC

## 2014-06-18 NOTE — Telephone Encounter (Signed)
Patient has not been seen since 2011 and the med's were filled 12/14/13 #30 with 5 refills. please advise     KP

## 2015-01-07 ENCOUNTER — Other Ambulatory Visit: Payer: Self-pay | Admitting: Family Medicine

## 2015-01-07 DIAGNOSIS — F411 Generalized anxiety disorder: Secondary | ICD-10-CM

## 2015-01-07 MED ORDER — ALPRAZOLAM 0.25 MG PO TABS: 0.2500 mg | ORAL_TABLET | Freq: Three times a day (TID) | ORAL | Status: DC | PRN

## 2015-01-10 ENCOUNTER — Other Ambulatory Visit: Payer: Self-pay | Admitting: Family Medicine

## 2015-01-10 DIAGNOSIS — F411 Generalized anxiety disorder: Secondary | ICD-10-CM

## 2015-01-10 MED ORDER — ALPRAZOLAM 0.25 MG PO TABS: 0.2500 mg | ORAL_TABLET | Freq: Three times a day (TID) | ORAL | Status: DC | PRN

## 2016-05-22 DIAGNOSIS — R350 Frequency of micturition: Secondary | ICD-10-CM | POA: Diagnosis not present

## 2016-05-28 DIAGNOSIS — O26891 Other specified pregnancy related conditions, first trimester: Secondary | ICD-10-CM | POA: Diagnosis not present

## 2016-05-28 DIAGNOSIS — Z3201 Encounter for pregnancy test, result positive: Secondary | ICD-10-CM | POA: Diagnosis not present

## 2016-05-28 DIAGNOSIS — N912 Amenorrhea, unspecified: Secondary | ICD-10-CM | POA: Diagnosis not present

## 2016-05-28 DIAGNOSIS — Z3A01 Less than 8 weeks gestation of pregnancy: Secondary | ICD-10-CM | POA: Diagnosis not present

## 2016-06-14 ENCOUNTER — Encounter (HOSPITAL_COMMUNITY): Payer: Self-pay | Admitting: *Deleted

## 2016-06-14 ENCOUNTER — Inpatient Hospital Stay (HOSPITAL_COMMUNITY): Payer: BLUE CROSS/BLUE SHIELD

## 2016-06-14 ENCOUNTER — Inpatient Hospital Stay (HOSPITAL_COMMUNITY)
Admission: AD | Admit: 2016-06-14 | Discharge: 2016-06-14 | Disposition: A | Discharge status: Home / Self Care | Payer: BLUE CROSS/BLUE SHIELD | Source: Ambulatory Visit | Attending: Obstetrics and Gynecology | Admitting: Obstetrics and Gynecology

## 2016-06-14 DIAGNOSIS — O209 Hemorrhage in early pregnancy, unspecified: Secondary | ICD-10-CM | POA: Diagnosis not present

## 2016-06-14 DIAGNOSIS — O3680X0 Pregnancy with inconclusive fetal viability, not applicable or unspecified: Secondary | ICD-10-CM | POA: Insufficient documentation

## 2016-06-14 DIAGNOSIS — Z3A00 Weeks of gestation of pregnancy not specified: Secondary | ICD-10-CM | POA: Diagnosis not present

## 2016-06-14 DIAGNOSIS — O021 Missed abortion: Secondary | ICD-10-CM

## 2016-06-14 DIAGNOSIS — Z3A01 Less than 8 weeks gestation of pregnancy: Secondary | ICD-10-CM | POA: Diagnosis not present

## 2016-06-14 DIAGNOSIS — O4690 Antepartum hemorrhage, unspecified, unspecified trimester: Secondary | ICD-10-CM

## 2016-06-14 DIAGNOSIS — O26899 Other specified pregnancy related conditions, unspecified trimester: Secondary | ICD-10-CM

## 2016-06-14 DIAGNOSIS — R109 Unspecified abdominal pain: Secondary | ICD-10-CM | POA: Diagnosis not present

## 2016-06-14 DIAGNOSIS — O4691 Antepartum hemorrhage, unspecified, first trimester: Secondary | ICD-10-CM | POA: Diagnosis not present

## 2016-06-14 LAB — URINALYSIS, ROUTINE W REFLEX MICROSCOPIC
Bilirubin Urine: NEGATIVE
GLUCOSE, UA: 250 mg/dL — AB
KETONES UR: 15 mg/dL — AB
NITRITE: POSITIVE — AB
PH: 6.5 (ref 5.0–8.0)
Specific Gravity, Urine: 1.02 (ref 1.005–1.030)

## 2016-06-14 LAB — CBC
HEMATOCRIT: 34.1 % — AB (ref 36.0–46.0)
Hemoglobin: 11.7 g/dL — ABNORMAL LOW (ref 12.0–15.0)
MCH: 30.6 pg (ref 26.0–34.0)
MCHC: 34.3 g/dL (ref 30.0–36.0)
MCV: 89.3 fL (ref 78.0–100.0)
Platelets: 260 10*3/uL (ref 150–400)
RBC: 3.82 MIL/uL — AB (ref 3.87–5.11)
RDW: 12.9 % (ref 11.5–15.5)
WBC: 6 10*3/uL (ref 4.0–10.5)

## 2016-06-14 LAB — URINALYSIS, MICROSCOPIC (REFLEX)

## 2016-06-14 LAB — POCT PREGNANCY, URINE: Preg Test, Ur: POSITIVE — AB

## 2016-06-14 LAB — HCG, QUANTITATIVE, PREGNANCY: HCG, BETA CHAIN, QUANT, S: 11494 m[IU]/mL — AB (ref <5)

## 2016-06-14 MED ORDER — IBUPROFEN 600 MG PO TABS: 600.0000 mg | ORAL_TABLET | Freq: Four times a day (QID) | ORAL | 1 refills | Status: DC | PRN

## 2016-06-14 MED ORDER — RHO D IMMUNE GLOBULIN 1500 UNIT/2ML IJ SOSY
300.0000 ug | PREFILLED_SYRINGE | Freq: Once | INTRAMUSCULAR | Status: AC
  Administered 2016-06-14: 300 ug via INTRAMUSCULAR
  Filled 2016-06-14: qty 2

## 2016-06-14 MED ORDER — OXYCODONE-ACETAMINOPHEN 5-325 MG PO TABS: 1.0000 | ORAL_TABLET | Freq: Four times a day (QID) | ORAL | 0 refills | Status: DC | PRN

## 2016-06-14 NOTE — MAU Note (Signed)
Pt seen in MAU after noted to have no FHT on US PT with mild spotting and cramping only at this time Reviewed US findings; possible causes; mgmt options All questions answered Pt opts for conservative mgmt at this time  Pt to call office on 06/17/16 for follow up - likely appt and repeat quant depending on sx.  Rx for percocet and ibuprofen given

## 2016-06-14 NOTE — MAU Note (Signed)
Had some bleeding last Wed.  Yesterday, started bleeding off and on, continues today.  Only sees it when she wipes, brownish.  Started cramping today.  Talked with Dr Mindi SlickerBanga yesterday, was told if it continues to come here.  Has been seen with pregnancy in office.

## 2016-06-14 NOTE — Discharge Instructions (Signed)

## 2016-06-14 NOTE — MAU Provider Note (Signed)
Chief Complaint: Vaginal Bleeding and Abdominal Cramping   First Provider Initiated Contact with Patient 06/14/16 [phone]     SUBJECTIVE HPI: Diane Johnston is a 45 y.o. G3P2002 at [redacted]w[redacted]d by 6 week [place] in office who presents to [facility] reporting intermittent light vaginal bleeding and mild low abd since 06/11/16.   O NEG  Location: suprapubic Quality: cramping Severity: 4/10 on pain scale Duration: few days Context: Early pregnancy, after two vigorous yoga classes Timing: intermittent Modifying factors: Improves w/ rest Associated signs and symptoms: Pos for vaginal bleeding and passing tiny clots. Neg for vaginal discharge, fever, chills, passage of tissue.   Past Medical History:  Diagnosis Date  . Anxiety   . H/O: vasectomy    HUSBAND HAS HAD VASECTOMY  . HSV-2 (herpes simplex virus 2) infection   . STD (sexually transmitted disease)    HSV 2   OB History  Gravida Para Term Preterm AB Living  3 2 2     2   SAB TAB Ectopic Multiple Live Births          2    # Outcome Date GA Lbr Len/2nd Weight Sex Delivery Anes PTL Lv  3 Current           2 Term     M Vag-Spont  N LIV  1 Term     F Vag-Spont  N LIV     Past Surgical History:  Procedure Laterality Date  . AUGMENTATION MAMMAPLASTY  2010   SILICONE  . TONSILLECTOMY AND ADENOIDECTOMY  1980   Social History   Social History  . Marital status: Single    Spouse name: N/A  . Number of children: N/A  . Years of education: N/A   Occupational History  . Not on file.   Social History Main Topics  . Smoking status: Former Smoker    Types: Cigarettes  . Smokeless tobacco: Never Used  . Alcohol use Yes     Comment: occasionally  . Drug use: No  . Sexual activity: Yes     Comment: HUSBAND WITH VASECTOMY   Other Topics Concern  . Not on file   Social History Narrative  . No narrative on file   No current facility-administered medications on file prior to encounter.    Current Outpatient Prescriptions on  File Prior to Encounter  Medication Sig Dispense Refill  . ALPRAZolam (XANAX) 0.25 MG tablet Take 1 tablet (0.25 mg total) by mouth 3 (three) times daily as needed for anxiety. 30 tablet 3  . escitalopram (LEXAPRO) 10 MG tablet Take 1 tablet (10 mg total) by mouth daily. 30 tablet 1  . ibuprofen (ADVIL,MOTRIN) 200 MG tablet Take 200 mg by mouth every 6 (six) hours as needed. Pain    . Multiple Vitamin (MULTIVITAMIN) capsule Take 1 capsule by mouth daily.       Allergies  Allergen Reactions  . Penicillins Rash    I have reviewed the past Medical Hx, Surgical Hx, Social Hx, Allergies and Medications.   Review of Systems  Constitutional: Negative for chills and fever.  Gastrointestinal: Positive for abdominal pain. Negative for constipation, diarrhea, nausea and vomiting.  Genitourinary: Positive for vaginal bleeding. Negative for dysuria, frequency, hematuria, urgency and vaginal discharge.  Musculoskeletal: Negative for back pain.  Neurological: Negative for dizziness.    OBJECTIVE Patient Vitals for the past 24 hrs:  BP Temp Temp src Pulse Resp SpO2 Weight  06/14/16 0713 (!) 94/42 - - - - - -  06/14/16  [id number] (!) 94/30 98.4 F (36.9 C) Oral 63 16 100 % -  06/14/16 0708 - - - - - - 121 lb 9.6 oz (55.2 kg)   Constitutional: Well-developed, well-nourished female in no acute distress. Extremely anxious.  Cardiovascular: normal rate Respiratory: normal rate and effort.  GI: Abd soft, non-tender.  MS: Extremities nontender, no edema, normal ROM Neurologic: Alert and oriented x 4.   LAB RESULTS Results for orders placed or performed during the hospital encounter of 06/14/16 (from the past 24 hour(s))  Pregnancy, urine POC     Status: Abnormal   Collection Time: 06/14/16  7:13 AM  Result Value Ref Range   Preg Test, Ur POSITIVE (A) NEGATIVE    MAU COURSE CBC, Rh work-up, ultrasound, wet prep and GC/chlamydia culture, UA  Care of pt turned over to Wynelle BourgeoisMarie Avo Schlachter, CNM at 0800.    [place], CNM 06/14/2016  7:59 AM  [place] Ob Comp Less 14 Wks  Result Date: 06/14/2016 CLINICAL DATA:  Vaginal bleeding in first trimester pregnancy EXAM: OBSTETRIC <14 WK US AND TRANSVAGINAL OB US TECHNIQUE: Both transabdominal and transvaginal ultrasound examinations were performed for complete evaluation of the gestation as well as the maternal uterus, adnexal regions, and pelvic cul-de-sac. Transvaginal technique was performed to assess early pregnancy. COMPARISON:  02/17/2009 pelvic ultrasound FINDINGS: Intrauterine gestational sac: Present but appears small versus size of fetal pole Yolk sac:  Not definitely identified Embryo:  Present Cardiac Activity: Absent Heart Rate: N/A  bpm CRL:  16.7  mm   8 w   0 d                  US EDC: Subchorionic hemorrhage:  None identified Maternal uterus/adnexae: RIGHT ovary normal size and morphology 3.4 x 2.1 x 2.4 cm. LEFT ovary normal size and morphology 3.0 x 1.9 x 2.4 cm. Trace free pelvic fluid. No adnexal masses. IMPRESSION: Gestational sac containing a fetal pole is identified within the uterus. However, no fetal cardiac activity is identified. Findings meet definitive criteria for failed pregnancy. This follows SRU consensus guidelines: Diagnostic Criteria for Nonviable Pregnancy Early in the First Trimester. Macy Mis Engl J Med [id number];369:1443-51. Electronically Signed   By: Ulyses SouthwardMark  Boles M.D.   On: 06/14/2016 09:01   [place] Ob Transvaginal  Result Date: 06/14/2016 CLINICAL DATA:  Vaginal bleeding in first trimester pregnancy EXAM: OBSTETRIC <14 WK US AND TRANSVAGINAL OB US TECHNIQUE: Both transabdominal and transvaginal ultrasound examinations were performed for complete evaluation of the gestation as well as the maternal uterus, adnexal regions, and pelvic cul-de-sac. Transvaginal technique was performed to assess early pregnancy. COMPARISON:  02/17/2009 pelvic ultrasound FINDINGS: Intrauterine gestational sac: Present but appears small versus size of fetal  pole Yolk sac:  Not definitely identified Embryo:  Present Cardiac Activity: Absent Heart Rate: N/A  bpm CRL:  16.7  mm   8 w   0 d                  US EDC: Subchorionic hemorrhage:  None identified Maternal uterus/adnexae: RIGHT ovary normal size and morphology 3.4 x 2.1 x 2.4 cm. LEFT ovary normal size and morphology 3.0 x 1.9 x 2.4 cm. Trace free pelvic fluid. No adnexal masses. IMPRESSION: Gestational sac containing a fetal pole is identified within the uterus. However, no fetal cardiac activity is identified. Findings meet definitive criteria for failed pregnancy. This follows SRU consensus guidelines: Diagnostic Criteria for Nonviable Pregnancy Early in the First Trimester. Macy Mis Engl J Med [id number];369:1443-51. Electronically Signed  By: Ulyses SouthwardMark  Boles M.D.   On: 06/14/2016 09:01   Results for orders placed or performed during the hospital encounter of 06/14/16 (from the past 24 hour(s))  Urinalysis, Routine w reflex microscopic     Status: Abnormal   Collection Time: 06/14/16  7:09 AM  Result Value Ref Range   Color, Urine RED (A) YELLOW   APPearance TURBID (A) CLEAR   Specific Gravity, Urine 1.020 1.005 - 1.030   pH 6.5 5.0 - 8.0   Glucose, UA 250 (A) NEGATIVE mg/dL   Hgb urine dipstick LARGE (A) NEGATIVE   Bilirubin Urine NEGATIVE NEGATIVE   Ketones, ur 15 (A) NEGATIVE mg/dL   Protein, ur >098>300 (A) NEGATIVE mg/dL   Nitrite POSITIVE (A) NEGATIVE   Leukocytes, UA MODERATE (A) NEGATIVE  Urinalysis, Microscopic (reflex)     Status: Abnormal   Collection Time: 06/14/16  7:09 AM  Result Value Ref Range   RBC / HPF TOO NUMEROUS TO COUNT 0 - 5 RBC/hpf   WBC, UA 6-30 0 - 5 WBC/hpf   Bacteria, UA MANY (A) NONE SEEN   Squamous Epithelial / LPF 0-5 (A) NONE SEEN  Pregnancy, urine POC     Status: Abnormal   Collection Time: 06/14/16  7:13 AM  Result Value Ref Range   Preg Test, Ur POSITIVE (A) NEGATIVE  hCG, quantitative, pregnancy     Status: Abnormal   Collection Time: 06/14/16  7:58 AM  Result  Value Ref Range   hCG, Beta Chain, Quant, S 11,494 (H) <5 mIU/mL  Rh IG workup (includes ABO/Rh)     Status: None (Preliminary result)   Collection Time: 06/14/16  7:59 AM  Result Value Ref Range   Gestational Age(Wks) 8.4    ABO/RH(D) O NEG    Antibody Screen NEG    Unit Number [id number]    Blood Component Type RHIG    Unit division 00    Status of Unit ISSUED    Transfusion Status OK TO TRANSFUSE   CBC     Status: Abnormal   Collection Time: 06/14/16  7:59 AM  Result Value Ref Range   WBC 6.0 4.0 - 10.5 K/uL   RBC 3.82 (L) 3.87 - 5.11 MIL/uL   Hemoglobin 11.7 (L) 12.0 - 15.0 g/dL   HCT 30.834.1 (L) 65.736.0 - 84.646.0 %   MCV 89.3 78.0 - 100.0 fL   MCH 30.6 26.0 - 34.0 pg   MCHC 34.3 30.0 - 36.0 g/dL   RDW 96.212.9 95.211.5 - 84.115.5 %   Platelets 260 150 - 400 K/uL    Discussed missed abortion Patient very tearful and upset Worried yoga class and stress caused this  Discussed options She elects conservative observation for now Dr Mindi SlickerBanga came and saw patient  Will check HCG today and follow in office Will see Dr Jackelyn KnifeMeisinger next week Will culture urine Rx Percocet and ibuprofen for pain  Aviva SignsMarie L Cruz Devilla, CNM  4

## 2016-06-15 LAB — URINE CULTURE: Culture: >=100000 — AB

## 2016-06-16 LAB — RH IG WORKUP (INCLUDES ABO/RH)
ABO/RH(D): O NEG
ANTIBODY SCREEN: NEGATIVE
GESTATIONAL AGE(WKS): 8.4
Unit division: 0

## 2016-06-17 DIAGNOSIS — N920 Excessive and frequent menstruation with regular cycle: Secondary | ICD-10-CM | POA: Diagnosis not present

## 2016-07-02 DIAGNOSIS — O021 Missed abortion: Secondary | ICD-10-CM | POA: Diagnosis not present

## 2016-11-04 ENCOUNTER — Encounter: Payer: Self-pay | Admitting: Gynecology

## 2016-11-23 ENCOUNTER — Ambulatory Visit (INDEPENDENT_AMBULATORY_CARE_PROVIDER_SITE_OTHER): Payer: BLUE CROSS/BLUE SHIELD | Admitting: Family Medicine

## 2016-11-23 ENCOUNTER — Encounter: Payer: Self-pay | Admitting: Family Medicine

## 2016-11-23 VITALS — BP 108/76 | HR 80 | Temp 98.8°F | Resp 16 | Ht 65.75 in | Wt 117.4 lb

## 2016-11-23 DIAGNOSIS — F411 Generalized anxiety disorder: Secondary | ICD-10-CM | POA: Diagnosis not present

## 2016-11-23 DIAGNOSIS — F418 Other specified anxiety disorders: Secondary | ICD-10-CM

## 2016-11-23 MED ORDER — ALPRAZOLAM 0.25 MG PO TABS: 0.2500 mg | ORAL_TABLET | Freq: Three times a day (TID) | ORAL | 0 refills | Status: DC | PRN

## 2016-11-23 NOTE — Progress Notes (Signed)
Patient ID: Diane Johnston, female   DOB: 06/16/1971, 46 y.o.   MRN: [id number]     Subjective:  I acted as a Neurosurgeonscribe for Dr. Zola Johnston.  Diane SchneidersSheketia, CMA   Patient ID: Diane Johnston, female    DOB: 04/18/1971, 46 y.o.   MRN: [id number]  Chief Complaint  Patient presents with  . Anxiety    HPI  Patient is in today for refill on Xanax.  She is dealing with some things and need to get the Xanax refilled.  Patient Care Team: Diane Johnston, Diane CongressYvonne R, DO as PCP - General   Past Medical History:  Diagnosis Date  . Anxiety   . H/O: vasectomy    HUSBAND HAS HAD VASECTOMY  . HSV-2 (herpes simplex virus 2) infection   . STD (sexually transmitted disease)    HSV 2    Past Surgical History:  Procedure Laterality Date  . AUGMENTATION MAMMAPLASTY  2010   SILICONE  . TONSILLECTOMY AND ADENOIDECTOMY  1980    Family History  Problem Relation Age of Onset  . Osteoporosis Maternal Grandmother   . Hypertension Paternal Grandmother   . Cancer Paternal Grandmother        OVARIAN CANCER    Social History   Social History  . Marital status: Single    Spouse name: N/A  . Number of children: N/A  . Years of education: N/A   Occupational History  . Not on file.   Social History Main Topics  . Smoking status: Former Smoker    Types: Cigarettes  . Smokeless tobacco: Never Used  . Alcohol use Yes     Comment: occasionally  . Drug use: No  . Sexual activity: Yes     Comment: HUSBAND WITH VASECTOMY   Other Topics Concern  . Not on file   Social History Narrative  . No narrative on file    Outpatient Medications Prior to Visit  Medication Sig Dispense Refill  . ibuprofen (ADVIL,MOTRIN) 600 MG tablet Take 1 tablet (600 mg total) by mouth every 6 (six) hours as needed. 30 tablet 1  . oxyCODONE-acetaminophen (PERCOCET/ROXICET) 5-325 MG tablet Take 1-2 tablets by mouth every 6 (six) hours as needed. 15 tablet 0  . Prenatal Vit-Fe Fumarate-FA (MULTIVITAMIN-PRENATAL) 27-0.8 MG TABS tablet  Take 1 tablet by mouth daily at 12 noon.     No facility-administered medications prior to visit.     Allergies  Allergen Reactions  . Penicillins Rash    Has patient had a PCN reaction causing immediate rash, facial/tongue/throat swelling, SOB or lightheadedness with hypotension: Yes Has patient had a PCN reaction causing severe rash involving mucus membranes or skin necrosis: No Has patient had a PCN reaction that required hospitalization No Has patient had a PCN reaction occurring within the last 10 years: No If all of the above answers are "NO", then may proceed with Cephalosporin use.     Review of Systems  Constitutional: Negative for fever and malaise/fatigue.  HENT: Negative for congestion.   Eyes: Negative for blurred vision.  Respiratory: Negative for cough and shortness of breath.   Cardiovascular: Negative for chest pain, palpitations and leg swelling.  Gastrointestinal: Negative for vomiting.  Musculoskeletal: Negative for back pain.  Skin: Negative for rash.  Neurological: Negative for loss of consciousness and headaches.       Objective:    Physical Exam  Constitutional: She is oriented to person, place, and time. She appears well-developed and well-nourished. No distress.  HENT:  Head: Normocephalic  and atraumatic.  Eyes: Conjunctivae are normal.  Neck: Normal range of motion. No thyromegaly present.  Cardiovascular: Normal rate and regular rhythm.   Pulmonary/Chest: Effort normal and breath sounds normal. She has no wheezes.  Abdominal: Soft. Bowel sounds are normal. There is no tenderness.  Musculoskeletal: Normal range of motion. She exhibits no edema or deformity.  Neurological: She is alert and oriented to person, place, and time.  Skin: Skin is warm and dry. She is not diaphoretic.  Psychiatric: She has a normal mood and affect.  Nursing note and vitals reviewed.   BP 108/76 (BP Location: Left Arm, Cuff Size: Normal)   Pulse 80   Temp 98.8 F  (37.1 C) (Oral)   Resp 16   Ht 5' 5.75" (1.67 m)   Wt 117 lb 6.4 oz (53.3 kg)   LMP 11/05/2016   SpO2 99%   Breastfeeding? Unknown   BMI 19.09 kg/m  Wt Readings from Last 3 Encounters:  11/23/16 117 lb 6.4 oz (53.3 kg)  06/14/16 121 lb 9.6 oz (55.2 kg)  06/25/12 120 lb (54.4 kg)   BP Readings from Last 3 Encounters:  11/23/16 108/76  06/14/16 96/58  10/31/12 112/70     Immunization History  Administered Date(s) Administered  . Tdap 04/18/2012    Health Maintenance  Topic Date Due  . HIV Screening  07/28/1986  . PAP SMEAR  01/05/2014  . INFLUENZA VACCINE  01/20/2017  . TETANUS/TDAP  04/18/2022    Lab Results  Component Value Date   WBC 6.0 06/14/2016   HGB 11.7 (L) 06/14/2016   HCT 34.1 (L) 06/14/2016   PLT 260 06/14/2016   GLUCOSE 83 06/25/2012   CHOL 216 (H) 04/18/2012   ALT 8 06/25/2012   AST 17 06/25/2012   NA 136 06/25/2012   K 4.0 06/25/2012   CL 103 06/25/2012   CREATININE 0.63 06/25/2012   BUN 9 06/25/2012   CO2 25 06/25/2012   TSH 1.267 06/25/2012    Lab Results  Component Value Date   TSH 1.267 06/25/2012   Lab Results  Component Value Date   WBC 6.0 06/14/2016   HGB 11.7 (L) 06/14/2016   HCT 34.1 (L) 06/14/2016   MCV 89.3 06/14/2016   PLT 260 06/14/2016   Lab Results  Component Value Date   NA 136 06/25/2012   K 4.0 06/25/2012   CO2 25 06/25/2012   GLUCOSE 83 06/25/2012   BUN 9 06/25/2012   CREATININE 0.63 06/25/2012   BILITOT 0.6 06/25/2012   ALKPHOS 53 06/25/2012   AST 17 06/25/2012   ALT 8 06/25/2012   PROT 6.4 06/25/2012   ALBUMIN 3.8 06/25/2012   CALCIUM 9.0 06/25/2012   Lab Results  Component Value Date   CHOL 216 (H) 04/18/2012   No results found for: HDL No results found for: LDLCALC No results found for: TRIG No results found for: CHOLHDL No results found for: ZOXW9U       Assessment & Plan:   Problem List Items Addressed This Visit      Unprioritized   Situational anxiety - Primary    Refill xanax--  only takes prn rto 6 months or sooner prn      Relevant Medications   ALPRAZolam (XANAX) 0.25 MG tablet      I have discontinued [name]'s multivitamin-prenatal, ibuprofen, oxyCODONE-acetaminophen, and ALPRAZolam. I have also changed her ALPRAZolam.  Meds ordered this encounter  Medications  . DISCONTD: ALPRAZolam (XANAX) 0.5 MG tablet    Sig: Take 1  tablet by mouth every 12 (twelve) hours as needed.  Marland Kitchen DISCONTD: ALPRAZolam (XANAX) 0.25 MG tablet    Refill:  0  . ALPRAZolam (XANAX) 0.25 MG tablet    Sig: Take 1 tablet (0.25 mg total) by mouth 3 (three) times daily as needed for anxiety.    Dispense:  30 tablet    Refill:  0    CMA served as scribe during this visit. History, Physical and Plan performed by medical provider. Documentation and orders reviewed and attested to.  Donato Schultz, DO

## 2016-11-23 NOTE — Assessment & Plan Note (Signed)
Refill xanax-- only takes prn rto 6 months or sooner prn

## 2016-11-23 NOTE — Patient Instructions (Signed)

## 2017-01-09 ENCOUNTER — Telehealth: Payer: Self-pay | Admitting: Family Medicine

## 2017-01-09 NOTE — Telephone Encounter (Signed)
Received [facility] fax pt requesting appointment.  Tried calling pt no answer left message

## 2017-01-11 ENCOUNTER — Other Ambulatory Visit (INDEPENDENT_AMBULATORY_CARE_PROVIDER_SITE_OTHER): Payer: BLUE CROSS/BLUE SHIELD

## 2017-01-11 ENCOUNTER — Encounter: Payer: Self-pay | Admitting: Internal Medicine

## 2017-01-11 ENCOUNTER — Ambulatory Visit (INDEPENDENT_AMBULATORY_CARE_PROVIDER_SITE_OTHER): Payer: BLUE CROSS/BLUE SHIELD | Admitting: Internal Medicine

## 2017-01-11 ENCOUNTER — Ambulatory Visit (INDEPENDENT_AMBULATORY_CARE_PROVIDER_SITE_OTHER)
Admission: RE | Admit: 2017-01-11 | Discharge: 2017-01-11 | Disposition: A | Discharge status: Home / Self Care | Payer: BLUE CROSS/BLUE SHIELD | Source: Ambulatory Visit | Attending: Internal Medicine | Admitting: Internal Medicine

## 2017-01-11 DIAGNOSIS — J029 Acute pharyngitis, unspecified: Secondary | ICD-10-CM

## 2017-01-11 DIAGNOSIS — R5383 Other fatigue: Secondary | ICD-10-CM | POA: Diagnosis not present

## 2017-01-11 DIAGNOSIS — R918 Other nonspecific abnormal finding of lung field: Secondary | ICD-10-CM | POA: Diagnosis not present

## 2017-01-11 LAB — CBC WITH DIFFERENTIAL/PLATELET
BASOS ABS: 0.1 10*3/uL (ref 0.0–0.1)
Basophils Relative: 1.5 % (ref 0.0–3.0)
EOS ABS: 0.2 10*3/uL (ref 0.0–0.7)
Eosinophils Relative: 2.3 % (ref 0.0–5.0)
HCT: 39.2 % (ref 36.0–46.0)
HEMOGLOBIN: 12.9 g/dL (ref 12.0–15.0)
LYMPHS ABS: 2.6 10*3/uL (ref 0.7–4.0)
Lymphocytes Relative: 29.7 % (ref 12.0–46.0)
MCHC: 33 g/dL (ref 30.0–36.0)
MCV: 93.9 fl (ref 78.0–100.0)
MONO ABS: 0.6 10*3/uL (ref 0.1–1.0)
Monocytes Relative: 6.7 % (ref 3.0–12.0)
Neutro Abs: 5.2 10*3/uL (ref 1.4–7.7)
Neutrophils Relative %: 59.8 % (ref 43.0–77.0)
Platelets: 262 10*3/uL (ref 150.0–400.0)
RBC: 4.17 Mil/uL (ref 3.87–5.11)
RDW: 14.1 % (ref 11.5–15.5)
WBC: 8.7 10*3/uL (ref 4.0–10.5)

## 2017-01-11 LAB — HEPATIC FUNCTION PANEL
ALT: 6 U/L (ref 0–35)
AST: 12 U/L (ref 0–37)
Albumin: 4.3 g/dL (ref 3.5–5.2)
Alkaline Phosphatase: 41 U/L (ref 39–117)
BILIRUBIN DIRECT: 0.2 mg/dL (ref 0.0–0.3)
BILIRUBIN TOTAL: 1 mg/dL (ref 0.2–1.2)
Total Protein: 6.6 g/dL (ref 6.0–8.3)

## 2017-01-11 LAB — URINALYSIS
Bilirubin Urine: NEGATIVE
HGB URINE DIPSTICK: NEGATIVE
Leukocytes, UA: NEGATIVE
Nitrite: NEGATIVE
Total Protein, Urine: NEGATIVE
URINE GLUCOSE: NEGATIVE
UROBILINOGEN UA: 0.2 (ref 0.0–1.0)
pH: 5.5 (ref 5.0–8.0)

## 2017-01-11 LAB — BASIC METABOLIC PANEL
BUN: 7 mg/dL (ref 6–23)
CALCIUM: 9.7 mg/dL (ref 8.4–10.5)
CO2: 26 mEq/L (ref 19–32)
CREATININE: 0.62 mg/dL (ref 0.40–1.20)
Chloride: 103 mEq/L (ref 96–112)
GFR: 110.41 mL/min (ref >60.00)
Glucose, Bld: 84 mg/dL (ref 70–99)
POTASSIUM: 4 meq/L (ref 3.5–5.1)
Sodium: 137 mEq/L (ref 135–145)

## 2017-01-11 LAB — VITAMIN B12: Vitamin B-12: 587 pg/mL (ref 211–911)

## 2017-01-11 LAB — CORTISOL: CORTISOL PLASMA: 5.1 ug/dL

## 2017-01-11 LAB — VITAMIN D 25 HYDROXY (VIT D DEFICIENCY, FRACTURES): VITD: 69.25 ng/mL (ref 30.00–100.00)

## 2017-01-11 LAB — SEDIMENTATION RATE: SED RATE: 2 mm/h (ref 0–20)

## 2017-01-11 LAB — TSH: TSH: 1.09 u[IU]/mL (ref 0.35–4.50)

## 2017-01-11 LAB — MONONUCLEOSIS SCREEN: MONO SCREEN: NEGATIVE

## 2017-01-11 NOTE — Assessment & Plan Note (Addendum)
?  etiology - poss a viral illness Rest No car traveling this week if still weak Labs  CXR Rapid strep Pt declined HIV, pregn test etc

## 2017-01-11 NOTE — Progress Notes (Signed)
Subjective:  Patient ID: Diane Johnston, female    DOB: 01/17/1971  Age: 46 y.o. MRN: [id number]  CC: No chief complaint on file.   HPI SAMELLA LUCCHETTI presents for fatigue x 3 weeks, ST, glands were swollen. Sleeping a lot... No energy. Ex husband w/recent mono. 2 kids are well. 1 cat. No tick bites  Outpatient Medications Prior to Visit  Medication Sig Dispense Refill  . ALPRAZolam (XANAX) 0.25 MG tablet Take 1 tablet (0.25 mg total) by mouth 3 (three) times daily as needed for anxiety. 30 tablet 0   No facility-administered medications prior to visit.     ROS Review of Systems  Constitutional: Positive for fatigue. Negative for activity change, appetite change, chills and unexpected weight change.  HENT: Positive for sore throat and voice change. Negative for congestion, nosebleeds, postnasal drip, sinus pressure and tinnitus.   Eyes: Positive for pain. Negative for visual disturbance.  Respiratory: Negative for cough and chest tightness.   Gastrointestinal: Negative for abdominal pain and nausea.  Genitourinary: Negative for difficulty urinating, frequency and vaginal pain.  Musculoskeletal: Negative for back pain and gait problem.  Skin: Negative for pallor and rash.  Neurological: Positive for weakness. Negative for dizziness, tremors, numbness and headaches.  Psychiatric/Behavioral: Negative for confusion and sleep disturbance. The patient is not nervous/anxious.     Objective:  BP 106/78 (BP Location: Right Arm, Patient Position: Sitting, Cuff Size: Normal)   Pulse 73   Temp 98.2 F (36.8 C) (Oral)   Ht 5' 5.75" (1.67 m)   Wt 120 lb (54.4 kg)   LMP 04/15/2016   SpO2 99%   BMI 19.52 kg/m   BP Readings from Last 3 Encounters:  01/11/17 106/78  11/23/16 108/76  06/14/16 96/58    Wt Readings from Last 3 Encounters:  01/11/17 120 lb (54.4 kg)  11/23/16 117 lb 6.4 oz (53.3 kg)  06/14/16 121 lb 9.6 oz (55.2 kg)    Physical Exam  Constitutional: She appears  well-developed. No distress.  HENT:  Head: Normocephalic.  Right Ear: External ear normal.  Left Ear: External ear normal.  Nose: Nose normal.  Mouth/Throat: No oropharyngeal exudate.  Eyes: Pupils are equal, round, and reactive to light. Conjunctivae are normal. Right eye exhibits no discharge. Left eye exhibits no discharge.  Neck: Normal range of motion. Neck supple. No JVD present. No tracheal deviation present. No thyromegaly present.  Cardiovascular: Normal rate, regular rhythm and normal heart sounds.   Pulmonary/Chest: No stridor. No respiratory distress. She has no wheezes.  Abdominal: Soft. Bowel sounds are normal. She exhibits no distension and no mass. There is no tenderness. There is no rebound and no guarding.  Musculoskeletal: She exhibits no edema or tenderness.  Lymphadenopathy:    She has no cervical adenopathy.  Neurological: She displays normal reflexes. No cranial nerve deficit. She exhibits normal muscle tone. Coordination normal.  Skin: No rash noted. No erythema.  Psychiatric: She has a normal mood and affect. Her behavior is normal. Judgment and thought content normal.  Eryth throat Tonsilar LNs are a little tender Appears tired  Lab Results  Component Value Date   WBC 6.0 06/14/2016   HGB 11.7 (L) 06/14/2016   HCT 34.1 (L) 06/14/2016   PLT 260 06/14/2016   GLUCOSE 83 06/25/2012   CHOL 216 (H) 04/18/2012   ALT 8 06/25/2012   AST 17 06/25/2012   NA 136 06/25/2012   K 4.0 06/25/2012   CL 103 06/25/2012   CREATININE 0.63 06/25/2012  BUN 9 06/25/2012   CO2 25 06/25/2012   TSH 1.267 06/25/2012    [place] Ob Comp Less 14 Wks  Result Date: 06/14/2016 CLINICAL DATA:  Vaginal bleeding in first trimester pregnancy EXAM: OBSTETRIC <14 WK US AND TRANSVAGINAL OB US TECHNIQUE: Both transabdominal and transvaginal ultrasound examinations were performed for complete evaluation of the gestation as well as the maternal uterus, adnexal regions, and pelvic cul-de-sac.  Transvaginal technique was performed to assess early pregnancy. COMPARISON:  02/17/2009 pelvic ultrasound FINDINGS: Intrauterine gestational sac: Present but appears small versus size of fetal pole Yolk sac:  Not definitely identified Embryo:  Present Cardiac Activity: Absent Heart Rate: N/A  bpm CRL:  16.7  mm   8 w   0 d                  US EDC: Subchorionic hemorrhage:  None identified Maternal uterus/adnexae: RIGHT ovary normal size and morphology 3.4 x 2.1 x 2.4 cm. LEFT ovary normal size and morphology 3.0 x 1.9 x 2.4 cm. Trace free pelvic fluid. No adnexal masses. IMPRESSION: Gestational sac containing a fetal pole is identified within the uterus. However, no fetal cardiac activity is identified. Findings meet definitive criteria for failed pregnancy. This follows SRU consensus guidelines: Diagnostic Criteria for Nonviable Pregnancy Early in the First Trimester. Macy Mis Engl J Med [id number];369:1443-51. Electronically Signed   By: Ulyses SouthwardMark  Boles M.D.   On: 06/14/2016 09:01   [place] Ob Transvaginal  Result Date: 06/14/2016 CLINICAL DATA:  Vaginal bleeding in first trimester pregnancy EXAM: OBSTETRIC <14 WK US AND TRANSVAGINAL OB US TECHNIQUE: Both transabdominal and transvaginal ultrasound examinations were performed for complete evaluation of the gestation as well as the maternal uterus, adnexal regions, and pelvic cul-de-sac. Transvaginal technique was performed to assess early pregnancy. COMPARISON:  02/17/2009 pelvic ultrasound FINDINGS: Intrauterine gestational sac: Present but appears small versus size of fetal pole Yolk sac:  Not definitely identified Embryo:  Present Cardiac Activity: Absent Heart Rate: N/A  bpm CRL:  16.7  mm   8 w   0 d                  US EDC: Subchorionic hemorrhage:  None identified Maternal uterus/adnexae: RIGHT ovary normal size and morphology 3.4 x 2.1 x 2.4 cm. LEFT ovary normal size and morphology 3.0 x 1.9 x 2.4 cm. Trace free pelvic fluid. No adnexal masses. IMPRESSION: Gestational sac  containing a fetal pole is identified within the uterus. However, no fetal cardiac activity is identified. Findings meet definitive criteria for failed pregnancy. This follows SRU consensus guidelines: Diagnostic Criteria for Nonviable Pregnancy Early in the First Trimester. Macy Mis Engl J Med [phone];369:1443-51. Electronically Signed   By: Ulyses SouthwardMark  Boles M.D.   On: 06/14/2016 09:01    Assessment & Plan:   There are no diagnoses linked to this encounter. I am having Ms. Eagon maintain her ALPRAZolam and multivitamin.  Meds ordered this encounter  Medications  . Multiple Vitamin (MULTIVITAMIN) tablet    Sig: Take 1 tablet by mouth daily.     Follow-up: No Follow-up on file.  Sonda PrimesAlex Sharonlee Nine, MD

## 2017-01-11 NOTE — Assessment & Plan Note (Signed)
Strep test Labs

## 2017-01-12 ENCOUNTER — Other Ambulatory Visit: Payer: Self-pay | Admitting: Internal Medicine

## 2017-01-12 DIAGNOSIS — R5383 Other fatigue: Secondary | ICD-10-CM

## 2017-01-13 ENCOUNTER — Encounter: Payer: Self-pay | Admitting: Internal Medicine

## 2017-01-13 ENCOUNTER — Other Ambulatory Visit (INDEPENDENT_AMBULATORY_CARE_PROVIDER_SITE_OTHER): Payer: BLUE CROSS/BLUE SHIELD

## 2017-01-13 ENCOUNTER — Telehealth: Payer: Self-pay | Admitting: Internal Medicine

## 2017-01-13 DIAGNOSIS — R5383 Other fatigue: Secondary | ICD-10-CM

## 2017-01-13 LAB — CORTISOL: Cortisol, Plasma: 5.7 ug/dL

## 2017-01-13 NOTE — Telephone Encounter (Addendum)
Shelbie ProctorBetty,  Jatasia needs a special blood test called Cortrosyn stimulation test. Pls check if we have Cosyntropin  (Cortrosyn) 0.25 mg for injections. If we do - pls ask the pt to come on Thur or Fri at 8-9 am for a Cortrosyn stimulation test fasting. If we do not have it - pls order it for Friday.  Instructions:  1. Go to the lab and have serum cortisol level drawn. 2. Go to Primary Care and have an injection of Cortrosyn IM 0.25 mg  3. Go back to the lab in 30-60 min and have Cortisol level drawn again.   Pls sch her to see Dr Laury AxonLowne on Fri  Thx  ------------------------------- Emailed to the pt: Imogene, Your cortisol is "low normal" again (5.7). We will have to check your "adrenal axis" function with a test called Cortrosyn stimulation test. It checks your adrenal gland and pituitary gland interaction and function. I'll copy you the instructions [facility]. We do not do it often - need to make sure we have the Rx.  Drink more fluids in the meantime. I'll be out of town till next Wed w/o an access to [employer] - please check with Dr Laury AxonLowne. Feel better! AP

## 2017-01-13 NOTE — Telephone Encounter (Signed)
Pt called about this. She is very concerned. Is there anything you would recommend? She also mentioned me to that she has noticed that the dizziness and fatigue has been going on for a while. She did come in to have the cortisol level rechecked this morning. She can be reached at [phone].

## 2017-01-14 ENCOUNTER — Encounter: Payer: Self-pay | Admitting: Family Medicine

## 2017-01-14 ENCOUNTER — Other Ambulatory Visit: Payer: Self-pay | Admitting: Endocrinology

## 2017-01-14 ENCOUNTER — Other Ambulatory Visit: Payer: Self-pay | Admitting: Family Medicine

## 2017-01-14 DIAGNOSIS — E2740 Unspecified adrenocortical insufficiency: Secondary | ICD-10-CM

## 2017-01-14 DIAGNOSIS — R7989 Other specified abnormal findings of blood chemistry: Secondary | ICD-10-CM

## 2017-01-14 DIAGNOSIS — R5383 Other fatigue: Secondary | ICD-10-CM

## 2017-01-14 MED ORDER — HYDROCORTISONE 5 MG PO TABS: ORAL_TABLET | ORAL | 2 refills | Status: DC

## 2017-01-14 NOTE — Telephone Encounter (Signed)
Patient informed of referral

## 2017-01-14 NOTE — Telephone Encounter (Signed)
Your level was normal -- your symptoms were just suspicious for AI which is why we are trying the hydrocortisone----- they are working on the endo app now

## 2017-01-14 NOTE — Telephone Encounter (Signed)
The [facility] office is not sure what to do with this message/Plot is out of the office on vacation/she was seen by him on the 23rd and the RN Team/clinical lead does not what to do with this result/instructions.Since you are the PCP they would like your input/or advice? Please response to Shawnie Dapperamara Williams RN at the [facility] office.

## 2017-01-14 NOTE — Telephone Encounter (Signed)
[facility] has been advised of this email---she is forwarding to dr lowne to ask for more direction on who/where does this type of lab and who should give injection, I do not carry this med at [facility], not sure if our lab at [facility] can perform this lab---will call patient after I talk with dr Laury Axonlowne to advise on how to proceed and where to go

## 2017-01-14 NOTE — Telephone Encounter (Signed)
Dr. Lucianne MussKumar,   Pt is available to come tomorrow, she has already taken the 2 tabs of the hydrocortisone for today, is this ok still for the test tomorrow? Also, is she to take it tomorrow before the test?

## 2017-01-14 NOTE — Telephone Encounter (Signed)
We need to refer to endocrine ----

## 2017-01-15 ENCOUNTER — Other Ambulatory Visit (INDEPENDENT_AMBULATORY_CARE_PROVIDER_SITE_OTHER): Payer: BLUE CROSS/BLUE SHIELD

## 2017-01-15 ENCOUNTER — Ambulatory Visit (INDEPENDENT_AMBULATORY_CARE_PROVIDER_SITE_OTHER): Payer: BLUE CROSS/BLUE SHIELD

## 2017-01-15 DIAGNOSIS — E2740 Unspecified adrenocortical insufficiency: Secondary | ICD-10-CM

## 2017-01-15 DIAGNOSIS — R5383 Other fatigue: Secondary | ICD-10-CM | POA: Diagnosis not present

## 2017-01-15 LAB — CORTISOL
Cortisol, Plasma: 7.6 ug/dL
Cortisol, Plasma: 18.8 ug/dL

## 2017-01-15 MED ORDER — COSYNTROPIN NICU IV SYRINGE 0.25 MG/ML (STANDARD DOSE)
0.2500 mg | Freq: Once | INTRAVENOUS | Status: AC
  Administered 2017-01-15: 0.25 mg via INTRAMUSCULAR

## 2017-01-17 ENCOUNTER — Encounter: Payer: Self-pay | Admitting: Family Medicine

## 2017-01-18 NOTE — Telephone Encounter (Signed)
Does she have a f/u appointment with endo? If she is not feeling any better she should come in to be seen.

## 2017-01-26 DIAGNOSIS — Z01419 Encounter for gynecological examination (general) (routine) without abnormal findings: Secondary | ICD-10-CM | POA: Diagnosis not present

## 2017-01-26 DIAGNOSIS — Z124 Encounter for screening for malignant neoplasm of cervix: Secondary | ICD-10-CM | POA: Diagnosis not present

## 2017-01-26 DIAGNOSIS — Z1389 Encounter for screening for other disorder: Secondary | ICD-10-CM | POA: Diagnosis not present

## 2017-01-26 DIAGNOSIS — Z1151 Encounter for screening for human papillomavirus (HPV): Secondary | ICD-10-CM | POA: Diagnosis not present

## 2017-01-26 DIAGNOSIS — Z130 Encounter for screening for diseases of the blood and blood-forming organs and certain disorders involving the immune mechanism: Secondary | ICD-10-CM | POA: Diagnosis not present

## 2017-01-26 DIAGNOSIS — Z1231 Encounter for screening mammogram for malignant neoplasm of breast: Secondary | ICD-10-CM | POA: Diagnosis not present

## 2017-01-26 DIAGNOSIS — Z6820 Body mass index (BMI) 20.0-20.9, adult: Secondary | ICD-10-CM | POA: Diagnosis not present

## 2017-03-02 ENCOUNTER — Telehealth: Payer: Self-pay | Admitting: Family Medicine

## 2017-03-02 DIAGNOSIS — F411 Generalized anxiety disorder: Secondary | ICD-10-CM

## 2017-03-02 NOTE — Telephone Encounter (Signed)
Refill x1   1 refill 

## 2017-03-02 NOTE — Telephone Encounter (Signed)
Pt is requesting refill on alprazolam 0.25mg .  Last OV: 11/23/2016 Last Fill: 11/23/2016 #60 and 0RF UDS: None seen  [place] database printed; placed on ledge   (PS. Do you know how to remove pregnancy status; pink box popping up at top of right corner of chart stating Pt is 2830w6d pregnant?)

## 2017-03-03 MED ORDER — ALPRAZOLAM 0.25 MG PO TABS: 0.2500 mg | ORAL_TABLET | Freq: Three times a day (TID) | ORAL | 1 refills | Status: DC | PRN

## 2017-03-03 NOTE — Telephone Encounter (Signed)
[facility] [place]

## 2017-03-12 ENCOUNTER — Telehealth: Payer: Self-pay | Admitting: Family Medicine

## 2017-03-12 NOTE — Telephone Encounter (Signed)
Called left message to call back 

## 2017-03-12 NOTE — Telephone Encounter (Signed)
-----   Message from Yvonne R Lowne Chase, DO sent at 03/11/2017  9:52 PM EDT ----- Pt called c/o in fatigue and had ? About her hydrocortisone-- she was supposed to have a f/u with endo after the test but it doe s not look like she did --  We need to make sure she has f/u with endo 

## 2017-03-12 NOTE — Telephone Encounter (Signed)
-----   Message from Donato Schultz, DO sent at 03/11/2017  9:52 PM EDT ----- Pt called c/o in fatigue and had ? About her hydrocortisone-- she was supposed to have a f/u with endo after the test but it doe s not look like she did --  We need to make sure she has f/u with endo

## 2017-03-15 ENCOUNTER — Other Ambulatory Visit: Payer: Self-pay | Admitting: Family Medicine

## 2017-03-15 ENCOUNTER — Telehealth: Payer: Self-pay | Admitting: Family Medicine

## 2017-03-15 DIAGNOSIS — F419 Anxiety disorder, unspecified: Secondary | ICD-10-CM

## 2017-03-15 MED ORDER — DIAZEPAM 10 MG PO TABS: 10.0000 mg | ORAL_TABLET | Freq: Two times a day (BID) | ORAL | 0 refills | Status: DC | PRN

## 2017-03-15 NOTE — Telephone Encounter (Signed)
I will give 1 rx but she need to come to office as soon as she is back from [place]

## 2017-03-15 NOTE — Telephone Encounter (Signed)
[facility] [place] [facility] in message. Called the patient to inform sent in---Had to leave a detailed message prescription has been sent/phoned in. Called once again before leaving for the evening and left another message prescription requested has been taken care of [facility] [place]

## 2017-03-15 NOTE — Telephone Encounter (Signed)
Pt [facility] [place], [address] per pt req she is there for a week on vacation a week off of work.  Pt states she corresponded with Lowne about this over the weekend and gave her the address for the pharm in [place]. Call pt [phone] If concerns. Call pt to confirm if you can.

## 2017-03-17 DIAGNOSIS — R350 Frequency of micturition: Secondary | ICD-10-CM | POA: Diagnosis not present

## 2017-03-24 ENCOUNTER — Encounter: Payer: Self-pay | Admitting: Family Medicine

## 2017-03-26 ENCOUNTER — Other Ambulatory Visit: Payer: Self-pay | Admitting: Family Medicine

## 2017-03-26 DIAGNOSIS — F419 Anxiety disorder, unspecified: Secondary | ICD-10-CM

## 2017-03-26 MED ORDER — DIAZEPAM 10 MG PO TABS: 10.0000 mg | ORAL_TABLET | Freq: Two times a day (BID) | ORAL | 0 refills | Status: DC | PRN

## 2017-03-30 ENCOUNTER — Encounter: Payer: Self-pay | Admitting: Family Medicine

## 2017-04-06 ENCOUNTER — Encounter: Payer: Self-pay | Admitting: *Deleted

## 2017-04-12 ENCOUNTER — Other Ambulatory Visit: Payer: Self-pay | Admitting: Family Medicine

## 2017-04-12 DIAGNOSIS — R7989 Other specified abnormal findings of blood chemistry: Secondary | ICD-10-CM

## 2017-06-09 DIAGNOSIS — R1031 Right lower quadrant pain: Secondary | ICD-10-CM | POA: Diagnosis not present

## 2017-06-09 DIAGNOSIS — R110 Nausea: Secondary | ICD-10-CM | POA: Diagnosis not present

## 2017-12-12 ENCOUNTER — Emergency Department (HOSPITAL_COMMUNITY): Payer: BLUE CROSS/BLUE SHIELD

## 2017-12-12 ENCOUNTER — Other Ambulatory Visit: Payer: Self-pay

## 2017-12-12 ENCOUNTER — Encounter (HOSPITAL_COMMUNITY): Payer: Self-pay | Admitting: Emergency Medicine

## 2017-12-12 ENCOUNTER — Emergency Department (HOSPITAL_COMMUNITY)
Admission: EM | Admit: 2017-12-12 | Discharge: 2017-12-12 | Disposition: A | Discharge status: Home / Self Care | Payer: BLUE CROSS/BLUE SHIELD | Attending: Emergency Medicine | Admitting: Emergency Medicine

## 2017-12-12 DIAGNOSIS — Z87891 Personal history of nicotine dependence: Secondary | ICD-10-CM | POA: Insufficient documentation

## 2017-12-12 DIAGNOSIS — J181 Lobar pneumonia, unspecified organism: Secondary | ICD-10-CM | POA: Diagnosis not present

## 2017-12-12 DIAGNOSIS — J189 Pneumonia, unspecified organism: Secondary | ICD-10-CM | POA: Diagnosis not present

## 2017-12-12 DIAGNOSIS — Z79899 Other long term (current) drug therapy: Secondary | ICD-10-CM | POA: Insufficient documentation

## 2017-12-12 DIAGNOSIS — R000 Tachycardia, unspecified: Secondary | ICD-10-CM | POA: Diagnosis not present

## 2017-12-12 DIAGNOSIS — R05 Cough: Secondary | ICD-10-CM | POA: Diagnosis not present

## 2017-12-12 DIAGNOSIS — R52 Pain, unspecified: Secondary | ICD-10-CM | POA: Diagnosis not present

## 2017-12-12 LAB — URINALYSIS, ROUTINE W REFLEX MICROSCOPIC
BILIRUBIN URINE: NEGATIVE
GLUCOSE, UA: NEGATIVE mg/dL
KETONES UR: NEGATIVE mg/dL
LEUKOCYTES UA: NEGATIVE
NITRITE: NEGATIVE
PH: 5 (ref 5.0–8.0)
Protein, ur: 30 mg/dL — AB
SPECIFIC GRAVITY, URINE: 1.013 (ref 1.005–1.030)

## 2017-12-12 LAB — I-STAT CHEM 8, ED
BUN: 5 mg/dL — ABNORMAL LOW (ref 6–20)
CREATININE: 0.8 mg/dL (ref 0.44–1.00)
Calcium, Ion: 1.18 mmol/L (ref 1.15–1.40)
Chloride: 101 mmol/L (ref 101–111)
Glucose, Bld: 123 mg/dL — ABNORMAL HIGH (ref 65–99)
HEMATOCRIT: 42 % (ref 36.0–46.0)
HEMOGLOBIN: 14.3 g/dL (ref 12.0–15.0)
POTASSIUM: 3.8 mmol/L (ref 3.5–5.1)
SODIUM: 137 mmol/L (ref 135–145)
TCO2: 25 mmol/L (ref 22–32)

## 2017-12-12 LAB — CBC WITH DIFFERENTIAL/PLATELET
Abs Immature Granulocytes: 0 10*3/uL (ref 0.0–0.1)
BASOS ABS: 0 10*3/uL (ref 0.0–0.1)
BASOS PCT: 1 %
EOS PCT: 0 %
Eosinophils Absolute: 0 10*3/uL (ref 0.0–0.7)
HCT: 42.9 % (ref 36.0–46.0)
Hemoglobin: 14.1 g/dL (ref 12.0–15.0)
IMMATURE GRANULOCYTES: 0 %
LYMPHS ABS: 0.6 10*3/uL — AB (ref 0.7–4.0)
Lymphocytes Relative: 11 %
MCH: 30.3 pg (ref 26.0–34.0)
MCHC: 32.9 g/dL (ref 30.0–36.0)
MCV: 92.3 fL (ref 78.0–100.0)
Monocytes Absolute: 0.4 10*3/uL (ref 0.1–1.0)
Monocytes Relative: 7 %
NEUTROS PCT: 81 %
Neutro Abs: 4.3 10*3/uL (ref 1.7–7.7)
PLATELETS: 226 10*3/uL (ref 150–400)
RBC: 4.65 MIL/uL (ref 3.87–5.11)
RDW: 12.8 % (ref 11.5–15.5)
WBC: 5.2 10*3/uL (ref 4.0–10.5)

## 2017-12-12 LAB — GROUP A STREP BY PCR: Group A Strep by PCR: NOT DETECTED

## 2017-12-12 LAB — I-STAT BETA HCG BLOOD, ED (MC, WL, AP ONLY)

## 2017-12-12 LAB — I-STAT CG4 LACTIC ACID, ED: LACTIC ACID, VENOUS: 1.01 mmol/L (ref 0.5–1.9)

## 2017-12-12 MED ORDER — DIPHENHYDRAMINE HCL 50 MG/ML IJ SOLN
25.0000 mg | Freq: Once | INTRAMUSCULAR | Status: AC
  Administered 2017-12-12: 25 mg via INTRAVENOUS
  Filled 2017-12-12: qty 1

## 2017-12-12 MED ORDER — KETOROLAC TROMETHAMINE 30 MG/ML IJ SOLN
30.0000 mg | Freq: Once | INTRAMUSCULAR | Status: AC
  Administered 2017-12-12: 30 mg via INTRAVENOUS
  Filled 2017-12-12: qty 1

## 2017-12-12 MED ORDER — MORPHINE SULFATE (PF) 4 MG/ML IV SOLN
4.0000 mg | Freq: Once | INTRAVENOUS | Status: AC
  Administered 2017-12-12: 4 mg via INTRAVENOUS
  Filled 2017-12-12: qty 1

## 2017-12-12 MED ORDER — LEVOFLOXACIN 750 MG PO TABS: 750.0000 mg | ORAL_TABLET | Freq: Every day | ORAL | 0 refills | Status: AC

## 2017-12-12 MED ORDER — PROCHLORPERAZINE EDISYLATE 10 MG/2ML IJ SOLN
10.0000 mg | Freq: Once | INTRAMUSCULAR | Status: AC
  Administered 2017-12-12: 10 mg via INTRAVENOUS
  Filled 2017-12-12: qty 2

## 2017-12-12 MED ORDER — SODIUM CHLORIDE 0.9 % IV BOLUS
1000.0000 mL | Freq: Once | INTRAVENOUS | Status: AC
  Administered 2017-12-12: 1000 mL via INTRAVENOUS

## 2017-12-12 MED ORDER — LEVOFLOXACIN IN D5W 750 MG/150ML IV SOLN
750.0000 mg | Freq: Once | INTRAVENOUS | Status: AC
  Administered 2017-12-12: 750 mg via INTRAVENOUS
  Filled 2017-12-12: qty 150

## 2017-12-12 MED ORDER — ACETAMINOPHEN 500 MG PO TABS
1000.0000 mg | ORAL_TABLET | Freq: Once | ORAL | Status: AC
  Administered 2017-12-12: 1000 mg via ORAL
  Filled 2017-12-12: qty 2

## 2017-12-12 MED ORDER — ONDANSETRON HCL 4 MG/2ML IJ SOLN
4.0000 mg | Freq: Once | INTRAMUSCULAR | Status: AC
  Administered 2017-12-12: 4 mg via INTRAVENOUS
  Filled 2017-12-12: qty 2

## 2017-12-12 NOTE — ED Notes (Signed)
Pt ambulated to bathroom with steady gait. 

## 2017-12-12 NOTE — ED Notes (Signed)
Patient ambulated to restroom with no distress and maintained 97% oxygen saturation to and from the restroom.

## 2017-12-12 NOTE — ED Notes (Signed)
Pt states unable to give urine sample at this time 

## 2017-12-12 NOTE — ED Triage Notes (Signed)
Pt states she was stuck on a plane Thursday night and has been sick since then with fever, chills, sweats, headache, cough, pain with coughing. Pt states she has no energy and could not get out of bed. Denies N/V/D. Has had some umbilical region intermittent pain. Afebrile at triage. Tachycardic and hypotensive, MAP of 55.

## 2017-12-12 NOTE — ED Provider Notes (Addendum)
[facility] EMERGENCY DEPARTMENT Provider Note   CSN: [id number] Arrival date & time: 12/12/17  1351     History   Chief Complaint Chief Complaint  Patient presents with  . Headache  . Generalized Body Aches    HPI Diane Johnston is a 47 y.o. female.  HPI   47 year old female presenting for evaluation of flulike symptoms.  Patient reports she flew to [place] for a meeting, and when she was flying back, there was a delay in the airport and when she was stuck in the airport for several hours.  She finally came home 4 days ago and since then she has had flulike symptoms which includes fever as high as 104, chills, headache, sinus congestion, sore throat, chest pain, nonproductive cough, chest tightness, body aches, dizziness and generalized weakness.  She does not have any appetite.  She did not complain of nausea vomiting diarrhea or dysuria or rash.  She did not have a flu shot.  She tries taking Delsym, and Robitussin at home with minimal improvement.  She denies travel outside the country.  Past Medical History:  Diagnosis Date  . Anxiety   . H/O: vasectomy    HUSBAND HAS HAD VASECTOMY    Patient Active Problem List   Diagnosis Date Noted  . Fatigue 01/11/2017  . Sore throat 01/11/2017  . Situational anxiety 11/23/2016  . Encounter for IUD insertion 10/31/2012    Past Surgical History:  Procedure Laterality Date  . AUGMENTATION MAMMAPLASTY  2010   SILICONE  . TONSILLECTOMY AND ADENOIDECTOMY  1980     OB History    Gravida  3   Para  2   Term  2   Preterm      AB      Living  2     SAB      TAB      Ectopic      Multiple      Live Births  2            Home Medications    Prior to Admission medications   Medication Sig Start Date End Date Taking? Authorizing Provider  diazepam (VALIUM) 10 MG tablet Take 1 tablet (10 mg total) by mouth every 12 (twelve) hours as needed for anxiety. 03/26/17   Donato Schultz, DO   Multiple Vitamin (MULTIVITAMIN) tablet Take 1 tablet by mouth daily.    [provider]    Family History Family History  Problem Relation Age of Onset  . Osteoporosis Maternal Grandmother   . Hypertension Paternal Grandmother     Social History Social History   Tobacco Use  . Smoking status: Former Smoker    Types: Cigarettes  . Smokeless tobacco: Never Used  Substance Use Topics  . Alcohol use: Yes    Comment: occasionally  . Drug use: No     Allergies   Penicillins   Review of Systems Review of Systems  All other systems reviewed and are negative.    Physical Exam Updated Vital Signs BP (!) 96/45 (BP Location: Left Arm)   Pulse (!) 110   Temp 98.4 F (36.9 C) (Oral)   Resp 18   LMP 12/10/2017   SpO2 100%   Physical Exam  Constitutional: She is oriented to person, place, and time. She appears well-developed and well-nourished. No distress.  Patient laying in bed in no acute distress, nontoxic in appearance  HENT:  Head: Normocephalic and atraumatic.  Ears: Normal TMs  bilaterally Nose: Normal nares Throat: Uvula midline no tonsillar enlargement no exudates, no trismus  Eyes: Pupils are equal, round, and reactive to light. Conjunctivae and EOM are normal.  Neck: Normal range of motion. Neck supple.  Cardiovascular:  Tachycardia without murmur rubs or gallops  Pulmonary/Chest: Effort normal and breath sounds normal. She has no wheezes. She has no rales.  Abdominal: Soft. There is no tenderness.  Lymphadenopathy:    She has cervical adenopathy.  Neurological: She is alert and oriented to person, place, and time. GCS eye subscore is 4. GCS verbal subscore is 5. GCS motor subscore is 6.  Skin: Skin is warm. No rash noted.  Psychiatric: She has a normal mood and affect.  Nursing note and vitals reviewed.    ED Treatments / Results  Labs (all labs ordered are listed, but only abnormal results are displayed) Labs Reviewed  CBC WITH  DIFFERENTIAL/PLATELET - Abnormal; Notable for the following components:      Result Value   Lymphs Abs 0.6 (*)    All other components within normal limits  I-STAT CHEM 8, ED - Abnormal; Notable for the following components:   BUN 5 (*)    Glucose, Bld 123 (*)    All other components within normal limits  GROUP A STREP BY PCR  URINALYSIS, ROUTINE W REFLEX MICROSCOPIC  I-STAT CG4 LACTIC ACID, ED  I-STAT BETA HCG BLOOD, ED ([facility], [facility], [facility] ONLY)    EKG None  Radiology Dg Chest 2 View  Result Date: 12/12/2017 CLINICAL DATA:  Cough and fever.  Flu symptoms. EXAM: CHEST - 2 VIEW COMPARISON:  01/11/2017 FINDINGS: The cardiac silhouette is normal in size. There is new abnormal right hilar density with streaky opacity extending inferiorly from the hilum. Right basilar airspace opacity and volume loss are present. The left lung is clear. No pleural effusion or pneumothorax is identified. Nipple shadows are noted bilaterally. No acute osseous abnormality is seen. IMPRESSION: Abnormal right hilar opacity and right lower lung airspace disease, most likely pneumonia in this setting. Followup PA and lateral chest X-ray is recommended in 3-4 weeks following trial of antibiotic therapy to ensure resolution and exclude underlying malignancy/hilar mass. Electronically Signed   By: Sebastian Ache M.D.   On: 12/12/2017 15:36    Procedures .Critical Care Performed by: Fayrene Helper, PA-C Authorized by: Fayrene Helper, PA-C   Critical care provider statement:    Critical care time (minutes):  35   Critical care start time:  12/12/2017 2:26 PM   Critical care end time:  12/12/2017 4:00 PM   Critical care time was exclusive of:  Separately billable procedures and treating other patients   Critical care was necessary to treat or prevent imminent or life-threatening deterioration of the following conditions:  Sepsis   Critical care was time spent personally by me on the following activities:  Blood draw for specimens,  development of treatment plan with patient or surrogate, discussions with primary provider, examination of patient, evaluation of patient's response to treatment, obtaining history from patient or surrogate, ordering and performing treatments and interventions, ordering and review of laboratory studies, ordering and review of radiographic studies, pulse oximetry, re-evaluation of patient's condition and review of old charts   I assumed direction of critical care for this patient from another provider in my specialty: no     (including critical care time)  Medications Ordered in ED Medications  levofloxacin (LEVAQUIN) IVPB 750 mg (has no administration in time range)  sodium chloride 0.9 % bolus  1,000 mL (1,000 mLs Intravenous New Bag/Given 12/12/17 1519)  morphine 4 MG/ML injection 4 mg (4 mg Intravenous Given 12/12/17 1520)  ondansetron (ZOFRAN) injection 4 mg (4 mg Intravenous Given 12/12/17 1520)     Initial Impression / Assessment and Plan / ED Course  I have reviewed the triage vital signs and the nursing notes.  Pertinent labs & imaging results that were available during my care of the patient were reviewed by me and considered in my medical decision making (see chart for details).     BP (!) 96/45 (BP Location: Left Arm)   Pulse (!) 110   Temp 98.4 F (36.9 C) (Oral)   Resp 18   LMP 12/10/2017   SpO2 100%    Final Clinical Impressions(s) / ED Diagnoses   Final diagnoses:  Community acquired pneumonia of right lower lobe of lung [facility](HCC)    ED Discharge Orders    None     2:26 PM Patient here with flulike symptoms after traveling by plane from [place].  Did not travel outside the country.  She did initially presents with mild hypertension with a blood pressure of 96/45 and tachycardic with a heart rate of 110.  She is afebrile.  No hypoxia.  Examination unremarkable.  Work-up initiated.  IV fluid given.  4:00 PM CXR showing RLL airspace disease, likely pneumonia.   Pt is PCN allergy, will give levaquin IV. Labs are reassuring.  Pt signed out to oncoming provider who will reassess pt and dispo as appropriate.    Fayrene Helperran, Rennie Rouch, PA-C 12/12/17 1601    Benjiman CorePickering, Nathan, MD 12/14/17 1450  ADDENDUM: CC time added    Fayrene Helperran, Sujay Grundman, PA-C 12/21/17 [id number]    Benjiman CorePickering, Nathan, MD 12/22/17 [phone]

## 2017-12-12 NOTE — ED Provider Notes (Signed)
[facility]  Refer to provider note for full history and physical examination.  Briefly, patient is a 47 year old female with 4-day history of flulike symptoms.  X-ray concerning for right lower lobe pneumonia.  Febrile to 103.7 F in the ED with improvement with antipyretics.  Levaquin given in the ED.  Awaiting UA.   Physical Exam  BP (!) 96/45 (BP Location: Left Arm)   Pulse (!) 110   Temp 98.4 F (36.9 C) (Oral)   Resp 18   LMP 12/10/2017   SpO2 100%   Physical Exam   MDM  Patient ambulated with stable SPO2 saturations.  Vitals have improved.  She has a soft blood pressures at baseline on chart review.  UA is not concerning for UTI or nephrolithiasis but does suggest dehydration.  She is given 2 L normal saline bolus in the ED.  Stable for discharge home with 5-day course of Levaquin, Tylenol and ibuprofen as needed for fever and pain.  She will follow-up with her PCP for reevaluation of her symptoms.  Discussed strict ED return precautions. Pt and patient's friend verbalized understanding of and agreement with plan and patient is safe for discharge home at this time.        Bennye AlmFawze, Devinn Voshell A, PA-C 12/12/17 1919    Tilden Fossaees, Elizabeth, MD 12/15/17 1239

## 2017-12-12 NOTE — Discharge Instructions (Addendum)
Please take all of your antibiotics until finished!   You may develop abdominal discomfort or diarrhea from the antibiotic.  You may help offset this with probiotics which you can buy or get in yogurt. Do not eat  or take the probiotics until 2 hours after your antibiotic.   Alternate 600 mg of ibuprofen and [phone] mg of Tylenol every 3 hours as needed for pain. Do not exceed 4000 mg of Tylenol daily.  Take ibuprofen with food to avoid upset stomach issues.  Drink plenty of fluids and get plenty of rest.  Follow-up with your PCP for reevaluation of your symptoms.  You would likely benefit from a repeat chest x-ray in 3 to 4 weeks to ensure your pneumonia has resolved.  Return to the emergency department immediately for any concerning signs or symptoms develop such as high fevers not controlled by ibuprofen or Tylenol, persistent chest pain or shortness of breath, passing out, or persistent vomiting.

## 2017-12-14 ENCOUNTER — Telehealth: Payer: Self-pay

## 2017-12-14 NOTE — Telephone Encounter (Signed)
Follow up call made to patient to schedule an ED follow up visit. Left message for return call on patients answering machine.

## 2017-12-28 ENCOUNTER — Inpatient Hospital Stay: Payer: BLUE CROSS/BLUE SHIELD | Admitting: Family Medicine

## 2017-12-29 ENCOUNTER — Telehealth: Payer: Self-pay | Admitting: *Deleted

## 2017-12-29 NOTE — Telephone Encounter (Signed)
Copied from CRM [phone]#[id number]. Topic: Quick Communication - Appointment Cancellation >> Dec 28, 2017 12:54 PM Leafy Roobinson, Norma J wrote: Patient called to cancel appointment scheduled for lowne Patient  HAS [id number]} rescheduled their appointment. Pt has sick child with vomiting and diarrhea  Route to department's PEC pool.

## 2017-12-29 NOTE — Telephone Encounter (Signed)
This patient was coded as a no show.  Not sure if we can wave her no show.  Just got message today.

## 2018-02-22 DIAGNOSIS — Z1231 Encounter for screening mammogram for malignant neoplasm of breast: Secondary | ICD-10-CM | POA: Diagnosis not present

## 2018-02-22 DIAGNOSIS — Z6820 Body mass index (BMI) 20.0-20.9, adult: Secondary | ICD-10-CM | POA: Diagnosis not present

## 2018-02-22 DIAGNOSIS — Z01419 Encounter for gynecological examination (general) (routine) without abnormal findings: Secondary | ICD-10-CM | POA: Diagnosis not present

## 2018-02-22 DIAGNOSIS — Z130 Encounter for screening for diseases of the blood and blood-forming organs and certain disorders involving the immune mechanism: Secondary | ICD-10-CM | POA: Diagnosis not present

## 2018-02-22 DIAGNOSIS — Z1389 Encounter for screening for other disorder: Secondary | ICD-10-CM | POA: Diagnosis not present

## 2018-04-29 ENCOUNTER — Encounter: Payer: Self-pay | Admitting: Family Medicine

## 2018-04-29 ENCOUNTER — Ambulatory Visit: Payer: BLUE CROSS/BLUE SHIELD | Admitting: Family Medicine

## 2018-04-29 VITALS — BP 130/49 | HR 79 | Temp 98.4°F | Resp 16 | Ht 66.0 in | Wt 126.0 lb

## 2018-04-29 DIAGNOSIS — R5383 Other fatigue: Secondary | ICD-10-CM

## 2018-04-29 DIAGNOSIS — Z79899 Other long term (current) drug therapy: Secondary | ICD-10-CM

## 2018-04-29 DIAGNOSIS — F419 Anxiety disorder, unspecified: Secondary | ICD-10-CM | POA: Diagnosis not present

## 2018-04-29 LAB — CBC WITH DIFFERENTIAL/PLATELET
BASOS PCT: 0.9 % (ref 0.0–3.0)
Basophils Absolute: 0.1 10*3/uL (ref 0.0–0.1)
EOS ABS: 0.1 10*3/uL (ref 0.0–0.7)
EOS PCT: 1.4 % (ref 0.0–5.0)
HEMATOCRIT: 41.1 % (ref 36.0–46.0)
HEMOGLOBIN: 13.9 g/dL (ref 12.0–15.0)
Lymphocytes Relative: 33.9 % (ref 12.0–46.0)
Lymphs Abs: 2 10*3/uL (ref 0.7–4.0)
MCHC: 33.8 g/dL (ref 30.0–36.0)
MCV: 89 fl (ref 78.0–100.0)
MONOS PCT: 6.8 % (ref 3.0–12.0)
Monocytes Absolute: 0.4 10*3/uL (ref 0.1–1.0)
Neutro Abs: 3.3 10*3/uL (ref 1.4–7.7)
Neutrophils Relative %: 57 % (ref 43.0–77.0)
Platelets: 304 10*3/uL (ref 150.0–400.0)
RBC: 4.62 Mil/uL (ref 3.87–5.11)
RDW: 13.3 % (ref 11.5–15.5)
WBC: 5.8 10*3/uL (ref 4.0–10.5)

## 2018-04-29 LAB — COMPREHENSIVE METABOLIC PANEL
ALT: 8 U/L (ref 0–35)
AST: 14 U/L (ref 0–37)
Albumin: 4.6 g/dL (ref 3.5–5.2)
Alkaline Phosphatase: 51 U/L (ref 39–117)
BILIRUBIN TOTAL: 0.5 mg/dL (ref 0.2–1.2)
BUN: 8 mg/dL (ref 6–23)
CALCIUM: 9.9 mg/dL (ref 8.4–10.5)
CHLORIDE: 103 meq/L (ref 96–112)
CO2: 30 mEq/L (ref 19–32)
CREATININE: 0.7 mg/dL (ref 0.40–1.20)
GFR: 95.43 mL/min (ref >60.00)
Glucose, Bld: 79 mg/dL (ref 70–99)
Potassium: 4.2 mEq/L (ref 3.5–5.1)
Sodium: 140 mEq/L (ref 135–145)
Total Protein: 7 g/dL (ref 6.0–8.3)

## 2018-04-29 LAB — TSH: TSH: 1.13 u[IU]/mL (ref 0.35–4.50)

## 2018-04-29 LAB — VITAMIN D 25 HYDROXY (VIT D DEFICIENCY, FRACTURES): VITD: 34 ng/mL (ref 30.00–100.00)

## 2018-04-29 LAB — VITAMIN B12: VITAMIN B 12: 340 pg/mL (ref 211–911)

## 2018-04-29 MED ORDER — ALPRAZOLAM 0.25 MG PO TABS
0.2500 mg | ORAL_TABLET | Freq: Two times a day (BID) | ORAL | 0 refills | Status: DC | PRN
Start: 2018-04-29 — End: 2018-07-17

## 2018-04-29 NOTE — Progress Notes (Addendum)
Patient ID: Diane Johnston, female    DOB: [birth year]  Age: 47 y.o. MRN: [id number]    Subjective:  Subjective  HPI Diane Johnston presents for f/u pneumonia in June.  She has been exhausted.  She got a job promotion July 1 and has been travelling a lot.  No other symptoms.     Review of Systems  Constitutional: Positive for fatigue. Negative for appetite change, diaphoresis and unexpected weight change.  Eyes: Negative for pain, redness and visual disturbance.  Respiratory: Negative for cough, chest tightness, shortness of breath and wheezing.   Cardiovascular: Negative for chest pain, palpitations and leg swelling.  Endocrine: Negative for cold intolerance, heat intolerance, polydipsia, polyphagia and polyuria.  Genitourinary: Negative for difficulty urinating, dysuria and frequency.  Neurological: Negative for dizziness, light-headedness, numbness and headaches.    History Past Medical History:  Diagnosis Date  . Anxiety   . H/O: vasectomy    HUSBAND HAS HAD VASECTOMY    She has a past surgical history that includes Tonsillectomy and adenoidectomy (1980) and Augmentation mammaplasty (2010).   Her family history includes Hypertension in her paternal grandmother; Osteoporosis in her maternal grandmother.She reports that she quit smoking about 7 months ago. Her smoking use included cigarettes. She has a 0.08 pack-year smoking history. She has never used smokeless tobacco. She reports current alcohol use of about 1.0 standard drinks of alcohol per week. She reports that she does not use drugs. She drinks alcohol only rarely  Current Outpatient Medications on File Prior to Visit  Medication Sig Dispense Refill  . Multiple Vitamin (MULTIVITAMIN) tablet Take 1 tablet by mouth daily.    Marland Kitchen ibuprofen (ADVIL,MOTRIN) 200 MG tablet Take 400-800 mg by mouth every 4 (four) hours as needed for fever, headache, mild pain, moderate pain or cramping.      No current facility-administered medications on  file prior to visit.      Objective:  Objective  Physical Exam  Constitutional: She is oriented to person, place, and time. She appears well-developed and well-nourished.  HENT:  Head: Normocephalic and atraumatic.  Eyes: Conjunctivae and EOM are normal.  Neck: Normal range of motion. Neck supple. No JVD present. Carotid bruit is not present. No thyromegaly present.  Cardiovascular: Normal rate, regular rhythm and normal heart sounds.  No murmur heard. Pulmonary/Chest: Effort normal and breath sounds normal. No respiratory distress. She has no wheezes. She has no rales. She exhibits no tenderness.  Musculoskeletal: She exhibits no edema.  Neurological: She is alert and oriented to person, place, and time.  Psychiatric: She has a normal mood and affect.  Nursing note and vitals reviewed.  BP (!) 130/49 (BP Location: Right Arm, Patient Position: Sitting, Cuff Size: Small)   Pulse 79   Temp 98.4 F (36.9 C) (Oral)   Resp 16   Ht 5\' 6"  (1.676 m)   Wt 126 lb (57.2 kg)   LMP 04/25/2018   SpO2 100%   BMI 20.34 kg/m  Wt Readings from Last 3 Encounters:  04/29/18 126 lb (57.2 kg)  12/12/17 120 lb (54.4 kg)  01/11/17 120 lb (54.4 kg)     Lab Results  Component Value Date   WBC 5.8 04/29/2018   HGB 13.9 04/29/2018   HCT 41.1 04/29/2018   PLT 304.0 04/29/2018   GLUCOSE 79 04/29/2018   CHOL 216 (H) 04/18/2012   ALT 8 04/29/2018   AST 14 04/29/2018   NA 140 04/29/2018   K 4.2 04/29/2018   CL 103 04/29/2018  CREATININE 0.70 04/29/2018   BUN 8 04/29/2018   CO2 30 04/29/2018   TSH 1.13 04/29/2018    Dg Chest 2 View  Result Date: 12/12/2017 CLINICAL DATA:  Cough and fever.  Flu symptoms. EXAM: CHEST - 2 VIEW COMPARISON:  01/11/2017 FINDINGS: The cardiac silhouette is normal in size. There is new abnormal right hilar density with streaky opacity extending inferiorly from the hilum. Right basilar airspace opacity and volume loss are present. The left lung is clear. No pleural  effusion or pneumothorax is identified. Nipple shadows are noted bilaterally. No acute osseous abnormality is seen. IMPRESSION: Abnormal right hilar opacity and right lower lung airspace disease, most likely pneumonia in this setting. Followup PA and lateral chest X-ray is recommended in 3-4 weeks following trial of antibiotic therapy to ensure resolution and exclude underlying malignancy/hilar mass. Electronically Signed   By: Sebastian Ache M.D.   On: 12/12/2017 15:36     Assessment & Plan:  Plan  I have discontinued Floreine Kingdon. Rising's diazepam. I am also having her start on ALPRAZolam. Additionally, I am having her maintain her multivitamin and ibuprofen.  Meds ordered this encounter  Medications  . ALPRAZolam (XANAX) 0.25 MG tablet    Sig: Take 1 tablet (0.25 mg total) by mouth 2 (two) times daily as needed for anxiety.    Dispense:  30 tablet    Refill:  0    Problem List Items Addressed This Visit      Unprioritized   Anxiety    Stable Takes xanax prn       Relevant Medications   ALPRAZolam (XANAX) 0.25 MG tablet   Other Relevant Orders   Pain Mgmt, Profile 8 w/Conf, U (Completed)   Fatigue - Primary    Pt has a stressful job and travels a lot She recently had pneumonia and is doing well but struggling to get her strength back        Relevant Orders   CBC with Differential/Platelet (Completed)   Comprehensive metabolic panel (Completed)   TSH (Completed)   Vitamin B12 (Completed)   Vitamin D (25 hydroxy) (Completed)    Other Visit Diagnoses    High risk medication use       Relevant Orders   Pain Mgmt, Profile 8 w/Conf, U (Completed)      Follow-up: Return if symptoms worsen or fail to improve.  Donato Schultz, DO

## 2018-04-29 NOTE — Assessment & Plan Note (Signed)
Pt has a stressful job and travels a lot She recently had pneumonia and is doing well but struggling to get her strength back

## 2018-04-29 NOTE — Assessment & Plan Note (Signed)
Stable.  Takes xanax prn.   

## 2018-04-29 NOTE — Patient Instructions (Signed)

## 2018-05-01 LAB — PAIN MGMT, PROFILE 8 W/CONF, U
6 ACETYLMORPHINE: NEGATIVE ng/mL (ref <10)
Alcohol Metabolites: POSITIVE ng/mL — AB (ref <500)
Amphetamines: NEGATIVE ng/mL (ref <500)
BENZODIAZEPINES: NEGATIVE ng/mL (ref <100)
Buprenorphine, Urine: NEGATIVE ng/mL (ref <5)
Cocaine Metabolite: NEGATIVE ng/mL (ref <150)
Creatinine: 20.3 mg/dL
ETHYL SULFATE (ETS): 428 ng/mL — AB (ref <100)
Ethyl Glucuronide (ETG): 2575 ng/mL — ABNORMAL HIGH (ref <500)
MARIJUANA METABOLITE: NEGATIVE ng/mL (ref <20)
MDMA: NEGATIVE ng/mL (ref <500)
OPIATES: NEGATIVE ng/mL (ref <100)
Oxidant: NEGATIVE ug/mL (ref <200)
Oxycodone: NEGATIVE ng/mL (ref <100)
pH: 6.81 (ref 4.5–9.0)

## 2018-05-11 ENCOUNTER — Ambulatory Visit: Payer: BLUE CROSS/BLUE SHIELD

## 2018-05-16 ENCOUNTER — Ambulatory Visit (INDEPENDENT_AMBULATORY_CARE_PROVIDER_SITE_OTHER): Payer: BLUE CROSS/BLUE SHIELD

## 2018-05-16 DIAGNOSIS — E538 Deficiency of other specified B group vitamins: Secondary | ICD-10-CM | POA: Diagnosis not present

## 2018-05-16 MED ORDER — CYANOCOBALAMIN 1000 MCG/ML IJ SOLN
1000.0000 ug | Freq: Once | INTRAMUSCULAR | Status: AC
  Administered 2018-05-16: 1000 ug via INTRAMUSCULAR

## 2018-05-16 NOTE — Patient Instructions (Addendum)
Per latest B12 labwork note from 04/29/18, Dr. Laury AxonLowne ordered monthly B12 injections X 3 months, with po supplement thereafter. Pt. Stated she already started taking po B12 gummies, and has noticed some improvement in energy. Injection given in R deltoid, pt. Tolerated well. Next monthly injection scheduled. Will route to Dr. Laury AxonLowne to clarify when she wants B12 re-check.

## 2018-06-13 ENCOUNTER — Ambulatory Visit: Payer: BLUE CROSS/BLUE SHIELD

## 2018-07-04 ENCOUNTER — Encounter: Payer: Self-pay | Admitting: Family Medicine

## 2018-07-05 ENCOUNTER — Encounter: Payer: Self-pay | Admitting: Family Medicine

## 2018-07-17 ENCOUNTER — Other Ambulatory Visit: Payer: Self-pay | Admitting: Family Medicine

## 2018-07-17 DIAGNOSIS — F419 Anxiety disorder, unspecified: Secondary | ICD-10-CM

## 2018-07-18 ENCOUNTER — Encounter: Payer: Self-pay | Admitting: Family Medicine

## 2018-07-18 ENCOUNTER — Other Ambulatory Visit: Payer: Self-pay | Admitting: Family Medicine

## 2018-07-18 DIAGNOSIS — F419 Anxiety disorder, unspecified: Secondary | ICD-10-CM

## 2018-07-18 MED ORDER — ALPRAZOLAM 0.25 MG PO TABS: 0.2500 mg | ORAL_TABLET | Freq: Two times a day (BID) | ORAL | 0 refills | Status: DC | PRN

## 2018-07-18 NOTE — Telephone Encounter (Signed)
Last Alprazolam RX: 04/29/18, #30 Last OV: 04/29/18 Next OV: none scheduled UDS: 04/29/18, low risk CSC: 04/29/18  CSR: No discrepancies identified

## 2018-12-07 ENCOUNTER — Encounter: Payer: Self-pay | Admitting: Family Medicine

## 2018-12-07 ENCOUNTER — Other Ambulatory Visit: Payer: Self-pay | Admitting: Family Medicine

## 2018-12-07 DIAGNOSIS — F419 Anxiety disorder, unspecified: Secondary | ICD-10-CM

## 2019-05-22 ENCOUNTER — Ambulatory Visit (INDEPENDENT_AMBULATORY_CARE_PROVIDER_SITE_OTHER): Payer: BC Managed Care – PPO | Admitting: Family Medicine

## 2019-05-22 ENCOUNTER — Encounter: Payer: Self-pay | Admitting: Family Medicine

## 2019-05-22 VITALS — Temp 98.0°F | Wt 129.4 lb

## 2019-05-22 DIAGNOSIS — S161XXA Strain of muscle, fascia and tendon at neck level, initial encounter: Secondary | ICD-10-CM

## 2019-05-22 MED ORDER — METHOCARBAMOL 500 MG PO TABS: 500.0000 mg | ORAL_TABLET | Freq: Four times a day (QID) | ORAL | 0 refills | Status: DC

## 2019-05-22 NOTE — Progress Notes (Signed)
Virtual Visit via Video Note  I connected with Diane Johnston on 05/22/19 at 11:40 AM EST by a video enabled telemedicine application and verified that I am speaking with the correct person using two identifiers.  Location: Patient: home  Provider: office    I discussed the limitations of evaluation and management by telemedicine and the availability of in person appointments. The patient expressed understanding and agreed to proceed.  History of Present Illness: Pt is home c/o pain in R shoulder blade and shoulder -- pain is shooting pain.  She wanted a massage but they wanted her to see her pcp first.  No known injury.     Observations/Objective: Vitals:   05/22/19 1126  Temp: 98 F (36.7 C)   No other vitals obtained Pt is in NAD Pain in L trap --no numbness / tingling   Assessment and Plan: 1. Strain of neck muscle, initial encounter Warm moist compresses  Stretching Muscle relaxer and advil Pt is headed out of town for work---- f/u Mon/ Tuesday prn  - methocarbamol (ROBAXIN) 500 MG tablet; Take 1 tablet (500 mg total) by mouth 4 (four) times daily.  Dispense: 45 tablet; Refill: 0  Follow Up Instructions:    I discussed the assessment and treatment plan with the patient. The patient was provided an opportunity to ask questions and all were answered. The patient agreed with the plan and demonstrated an understanding of the instructions.   The patient was advised to call back or seek an in-person evaluation if the symptoms worsen or if the condition fails to improve as anticipated.  I provided 15 minutes of non-face-to-face time during this encounter.   Ann Held, DO

## 2019-09-15 ENCOUNTER — Encounter: Payer: Self-pay | Admitting: Family Medicine

## 2019-09-15 ENCOUNTER — Other Ambulatory Visit: Payer: Self-pay

## 2019-09-15 ENCOUNTER — Ambulatory Visit: Payer: BC Managed Care – PPO | Admitting: Family Medicine

## 2019-09-15 VITALS — BP 100/60 | HR 76 | Temp 98.0°F | Resp 18 | Ht 66.0 in | Wt 122.4 lb

## 2019-09-15 DIAGNOSIS — F419 Anxiety disorder, unspecified: Secondary | ICD-10-CM

## 2019-09-15 DIAGNOSIS — Z79899 Other long term (current) drug therapy: Secondary | ICD-10-CM

## 2019-09-15 MED ORDER — ALPRAZOLAM 0.25 MG PO TABS: 0.2500 mg | ORAL_TABLET | Freq: Two times a day (BID) | ORAL | 1 refills | Status: DC | PRN

## 2019-09-15 NOTE — Patient Instructions (Signed)

## 2019-09-15 NOTE — Progress Notes (Signed)
Patient ID: Diane Johnston, female    DOB: 1970/11/13  Age: 49 y.o. MRN: [id number]    Subjective:  Subjective  HPI Diane Johnston presents for f/u f/u anxiety  No complaints.  She uses the med for travel only  Review of Systems  Constitutional: Negative for appetite change, diaphoresis, fatigue and unexpected weight change.  Eyes: Negative for pain, redness and visual disturbance.  Respiratory: Negative for cough, chest tightness, shortness of breath and wheezing.   Cardiovascular: Negative for chest pain, palpitations and leg swelling.  Endocrine: Negative for cold intolerance, heat intolerance, polydipsia, polyphagia and polyuria.  Genitourinary: Negative for difficulty urinating, dysuria and frequency.  Neurological: Negative for dizziness, light-headedness, numbness and headaches.    History Past Medical History:  Diagnosis Date  . Anxiety   . H/O: vasectomy    HUSBAND HAS HAD VASECTOMY    She has a past surgical history that includes Tonsillectomy and adenoidectomy (1980) and Augmentation mammaplasty (2010).   Her family history includes Hypertension in her paternal grandmother; Osteoporosis in her maternal grandmother.She reports that she quit smoking about 2 years ago. Her smoking use included cigarettes. She has a 0.08 pack-year smoking history. She has never used smokeless tobacco. She reports current alcohol use of about 1.0 standard drinks of alcohol per week. She reports that she does not use drugs.  Current Outpatient Medications on File Prior to Visit  Medication Sig Dispense Refill  . ibuprofen (ADVIL,MOTRIN) 200 MG tablet Take 400-800 mg by mouth every 4 (four) hours as needed for fever, headache, mild pain, moderate pain or cramping.     . Multiple Vitamin (MULTIVITAMIN) tablet Take 1 tablet by mouth daily.     No current facility-administered medications on file prior to visit.     Objective:  Objective  Physical Exam Nursing note reviewed. Exam conducted with a  chaperone present.  Constitutional:      Appearance: She is well-developed.  HENT:     Head: Normocephalic and atraumatic.  Eyes:     Conjunctiva/sclera: Conjunctivae normal.  Neck:     Thyroid: No thyromegaly.     Vascular: No carotid bruit or JVD.  Cardiovascular:     Rate and Rhythm: Normal rate and regular rhythm.     Heart sounds: Normal heart sounds. No murmur.  Pulmonary:     Effort: Pulmonary effort is normal. No respiratory distress.     Breath sounds: Normal breath sounds. No wheezing or rales.  Chest:     Chest wall: No tenderness.  Musculoskeletal:     Cervical back: Normal range of motion and neck supple.  Neurological:     Mental Status: She is alert and oriented to person, place, and time.    BP 100/60 (BP Location: Left Arm, Patient Position: Sitting, Cuff Size: Normal)   Pulse 76   Temp 98 F (36.7 C) (Temporal)   Resp 18   Ht 5\' 6"  (1.676 m)   Wt 122 lb 6.4 oz (55.5 kg)   SpO2 98%   BMI 19.76 kg/m  Wt Readings from Last 3 Encounters:  09/15/19 122 lb 6.4 oz (55.5 kg)  05/22/19 129 lb 6.4 oz (58.7 kg)  04/29/18 126 lb (57.2 kg)     Lab Results  Component Value Date   WBC 5.8 04/29/2018   HGB 13.9 04/29/2018   HCT 41.1 04/29/2018   PLT 304.0 04/29/2018   GLUCOSE 79 04/29/2018   CHOL 216 (H) 04/18/2012   ALT 8 04/29/2018   AST 14 04/29/2018  NA 140 04/29/2018   K 4.2 04/29/2018   CL 103 04/29/2018   CREATININE 0.70 04/29/2018   BUN 8 04/29/2018   CO2 30 04/29/2018   TSH 1.13 04/29/2018    DG Chest 2 View  Result Date: 12/12/2017 CLINICAL DATA:  Cough and fever.  Flu symptoms. EXAM: CHEST - 2 VIEW COMPARISON:  01/11/2017 FINDINGS: The cardiac silhouette is normal in size. There is new abnormal right hilar density with streaky opacity extending inferiorly from the hilum. Right basilar airspace opacity and volume loss are present. The left lung is clear. No pleural effusion or pneumothorax is identified. Nipple shadows are noted bilaterally.  No acute osseous abnormality is seen. IMPRESSION: Abnormal right hilar opacity and right lower lung airspace disease, most likely pneumonia in this setting. Followup PA and lateral chest X-ray is recommended in 3-4 weeks following trial of antibiotic therapy to ensure resolution and exclude underlying malignancy/hilar mass. Electronically Signed   By: Sebastian Ache M.D.   On: 12/12/2017 15:36     Assessment & Plan:  Plan  I have discontinued Tatjana Turcott. Pote's methocarbamol. I am also having her maintain her multivitamin, ibuprofen, and ALPRAZolam.  Meds ordered this encounter  Medications  . ALPRAZolam (XANAX) 0.25 MG tablet    Sig: Take 1 tablet (0.25 mg total) by mouth 2 (two) times daily as needed for anxiety.    Dispense:  30 tablet    Refill:  1    Problem List Items Addressed This Visit      Unprioritized   Anxiety   Relevant Medications   ALPRAZolam (XANAX) 0.25 MG tablet    stable --- uds and contract updated Database reviewed   Follow-up: Return in about 1 year (around 09/14/2020), or if symptoms worsen or fail to improve, for annual exam, fasting.  Donato Schultz, DO

## 2019-09-17 LAB — PAIN MGMT, PROFILE 8 W/CONF, U
6 Acetylmorphine: NEGATIVE ng/mL
Alcohol Metabolites: POSITIVE ng/mL — AB (ref <500)
Amphetamines: NEGATIVE ng/mL
Benzodiazepines: NEGATIVE ng/mL
Buprenorphine, Urine: NEGATIVE ng/mL
Cocaine Metabolite: NEGATIVE ng/mL
Creatinine: 95.8 mg/dL
Ethyl Glucuronide (ETG): 84607 ng/mL
Ethyl Sulfate (ETS): 20549 ng/mL
MDMA: NEGATIVE ng/mL
Marijuana Metabolite: NEGATIVE ng/mL
Opiates: NEGATIVE ng/mL
Oxidant: NEGATIVE ug/mL
Oxycodone: NEGATIVE ng/mL
pH: 6.5 (ref 4.5–9.0)

## 2019-12-28 IMAGING — DX DG CHEST 2V
2 series · 2 of 2 positions shown · non-contrast
Comparison: 01/11/2017

CLINICAL DATA: Cough and fever.  Flu symptoms.

EXAM:
CHEST - 2 VIEW

[w chest lat]
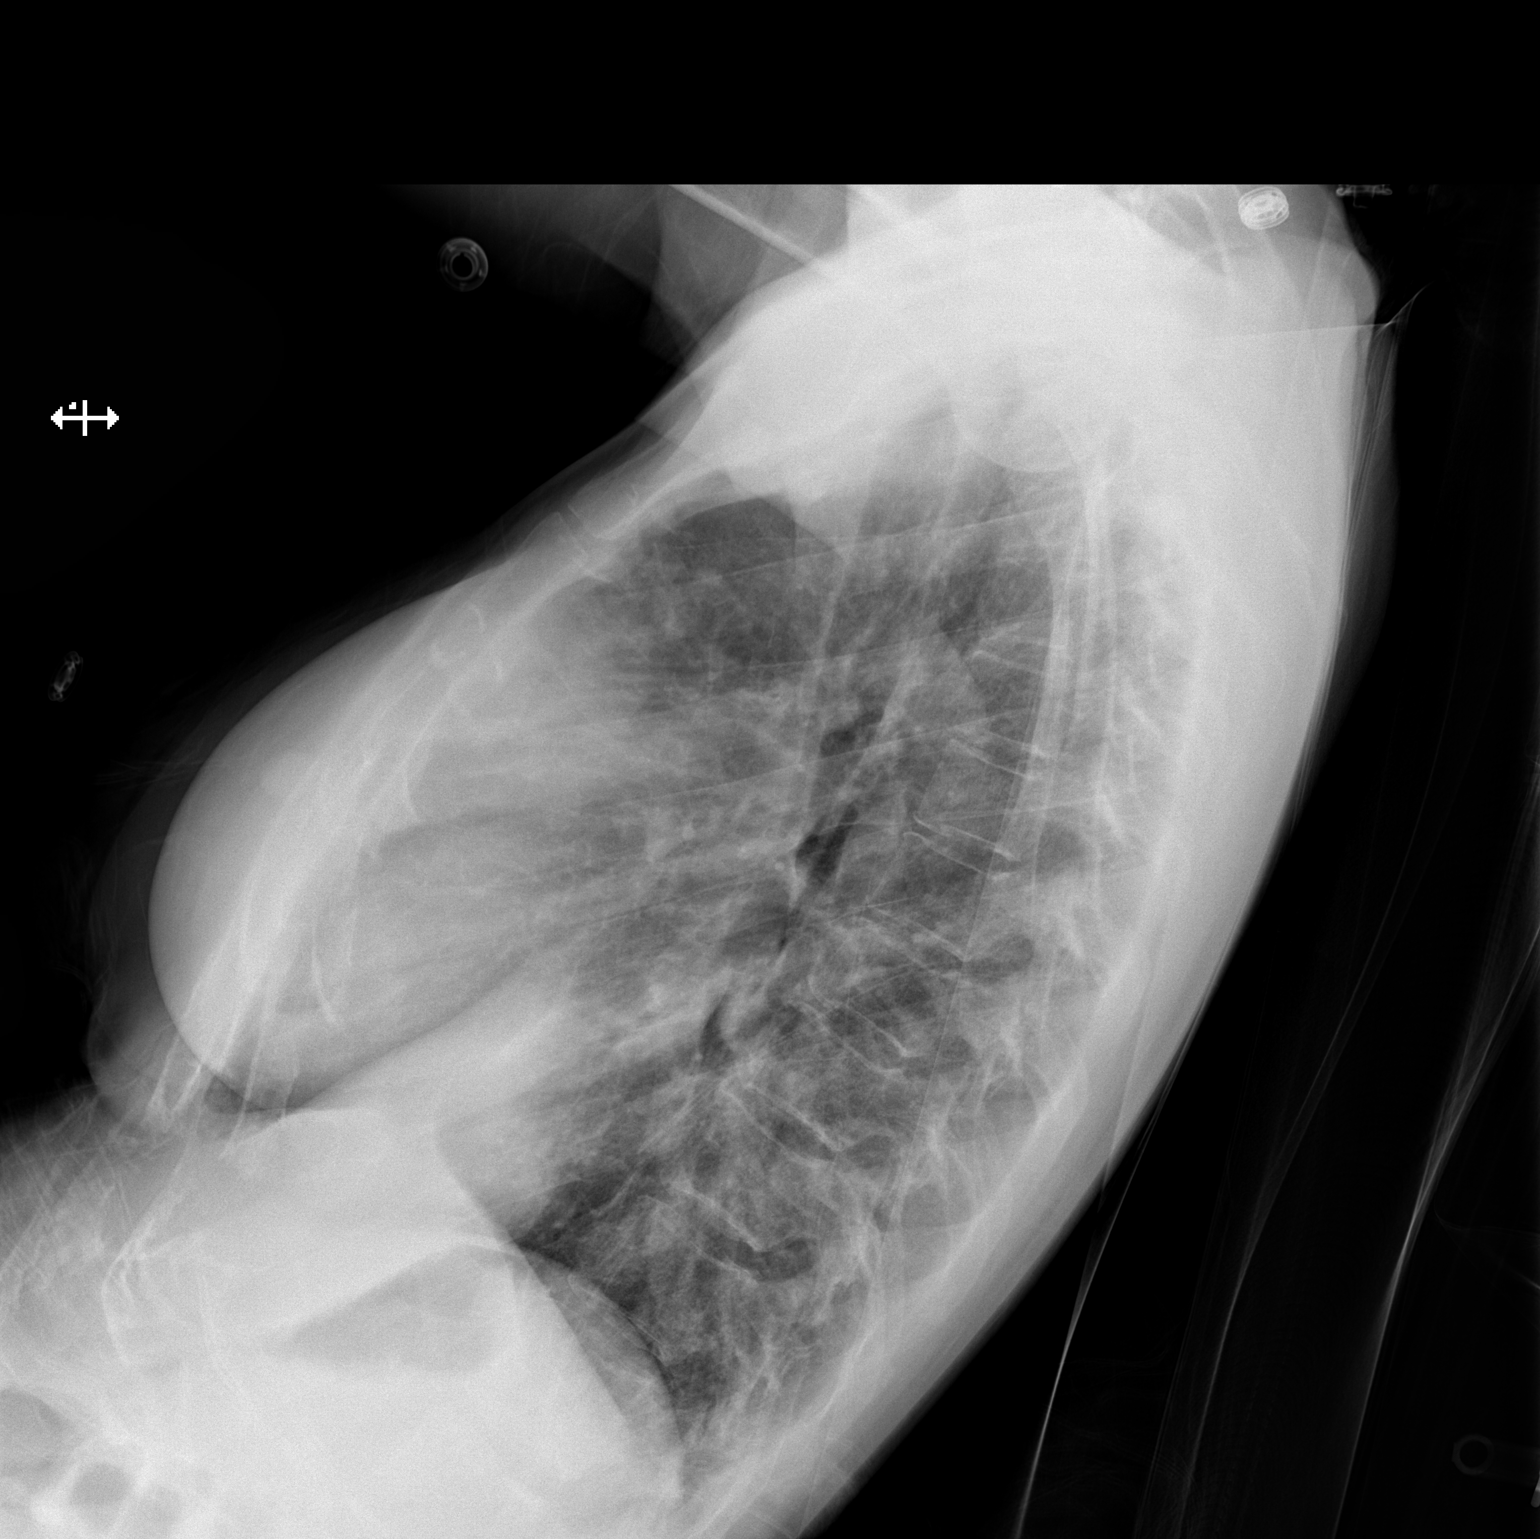

[x chest ap]
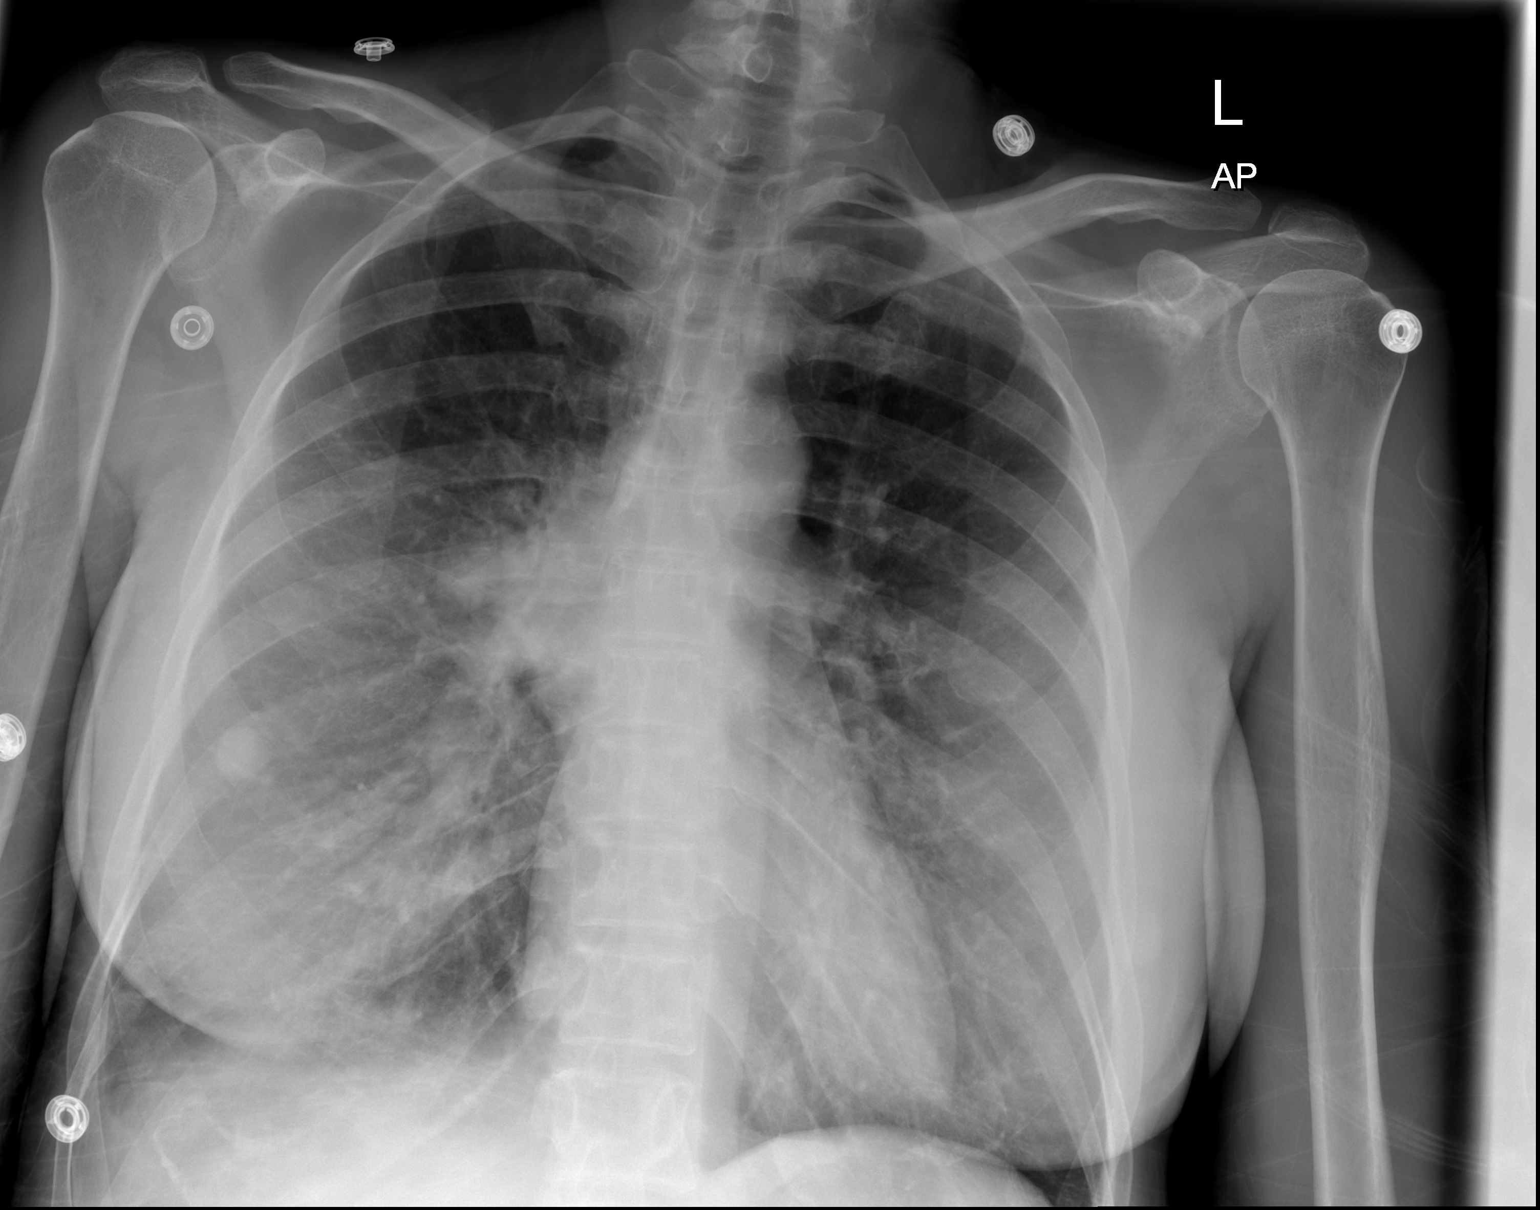

[2 of 2 positions shown; findings below may reference images not displayed]

FINDINGS: The cardiac silhouette is normal in size. There is new abnormal
right hilar density with streaky opacity extending inferiorly from
the hilum. Right basilar airspace opacity and volume loss are
present. The left lung is clear. No pleural effusion or pneumothorax
is identified. Nipple shadows are noted bilaterally. No acute
osseous abnormality is seen.
IMPRESSION: Abnormal right hilar opacity and right lower lung airspace disease,
most likely pneumonia in this setting. Followup PA and lateral chest
X-ray is recommended in 3-4 weeks following trial of antibiotic
therapy to ensure resolution and exclude underlying malignancy/hilar
mass.

## 2020-01-21 ENCOUNTER — Encounter: Payer: Self-pay | Admitting: Family Medicine

## 2020-01-21 DIAGNOSIS — F419 Anxiety disorder, unspecified: Secondary | ICD-10-CM

## 2020-01-22 ENCOUNTER — Other Ambulatory Visit: Payer: Self-pay | Admitting: Family Medicine

## 2020-01-22 DIAGNOSIS — F419 Anxiety disorder, unspecified: Secondary | ICD-10-CM

## 2020-01-22 MED ORDER — ALPRAZOLAM 0.25 MG PO TABS: 0.2500 mg | ORAL_TABLET | Freq: Two times a day (BID) | ORAL | 1 refills | Status: DC | PRN

## 2020-01-22 NOTE — Telephone Encounter (Signed)
Alprazolam refill.   Last OV: 09/15/2019 Last Fill: 09/15/2019 #30 and 1RF Pt sig: 1 tab bid prn UDS: 09/15/2019 Low risk

## 2020-01-22 NOTE — Telephone Encounter (Signed)
Refilled

## 2020-01-31 ENCOUNTER — Encounter: Payer: Self-pay | Admitting: Family Medicine

## 2020-01-31 NOTE — Telephone Encounter (Signed)
Please advise 

## 2020-01-31 NOTE — Telephone Encounter (Signed)
This has to be in pt chart not mom's --- can you put it in pt chart

## 2020-02-01 NOTE — Telephone Encounter (Signed)
Telephone note created °

## 2020-03-17 ENCOUNTER — Encounter: Payer: Self-pay | Admitting: Family Medicine

## 2020-03-17 DIAGNOSIS — F419 Anxiety disorder, unspecified: Secondary | ICD-10-CM

## 2020-03-18 MED ORDER — ALPRAZOLAM 0.25 MG PO TABS: 0.2500 mg | ORAL_TABLET | Freq: Two times a day (BID) | ORAL | 1 refills | Status: DC | PRN

## 2020-03-18 NOTE — Telephone Encounter (Signed)
Requesting:  Xanax Contract: 09/15/19 UDS: 09/15/19, low risk Last OV: 09/15/19 Next OV: N/A Last Refill:  01/22/20, #30--1 RF Database:   Please advise

## 2020-05-12 ENCOUNTER — Encounter: Payer: Self-pay | Admitting: Family Medicine

## 2020-05-13 ENCOUNTER — Other Ambulatory Visit: Payer: Self-pay | Admitting: Family Medicine

## 2020-05-13 DIAGNOSIS — F419 Anxiety disorder, unspecified: Secondary | ICD-10-CM

## 2020-05-13 MED ORDER — ALPRAZOLAM 0.25 MG PO TABS: 0.2500 mg | ORAL_TABLET | Freq: Two times a day (BID) | ORAL | 1 refills | Status: DC | PRN

## 2020-05-13 NOTE — Telephone Encounter (Signed)
Refill sent.

## 2020-05-13 NOTE — Telephone Encounter (Signed)
Requesting: alprazolam 0.25mg  Contract: 09/15/19 UDS: 09/15/19 Last Visit: 09/15/19 Next Visit: None scheduled Last Refill: 03/18/2020 #30 and 1RF Pt sig: 1 tab bid prn  Please Advise

## 2020-05-14 ENCOUNTER — Encounter: Payer: Self-pay | Admitting: Family Medicine

## 2020-05-14 NOTE — Telephone Encounter (Signed)
This has to be sent in pt chart

## 2020-05-14 NOTE — Telephone Encounter (Signed)
I looked at Diane Johnston's chart and the last refill for [facility] was in March by an historical provider. Please advise

## 2020-05-22 ENCOUNTER — Encounter: Payer: Self-pay | Admitting: Family Medicine

## 2020-06-12 DIAGNOSIS — Z23 Encounter for immunization: Secondary | ICD-10-CM | POA: Diagnosis not present

## 2020-07-29 ENCOUNTER — Encounter: Payer: Self-pay | Admitting: Family Medicine

## 2020-07-29 DIAGNOSIS — F419 Anxiety disorder, unspecified: Secondary | ICD-10-CM

## 2020-07-29 MED ORDER — ALPRAZOLAM 0.25 MG PO TABS: 0.2500 mg | ORAL_TABLET | Freq: Two times a day (BID) | ORAL | 1 refills | Status: DC | PRN

## 2020-07-29 NOTE — Telephone Encounter (Signed)
Requesting: alprazolam 0.25mg  Contract: 09/15/2019 UDS: 09/15/2019 Last Visit: 09/15/2019 Next Visit: 08/01/2020 Last Refill: 05/13/2020 #30 and 1RF Pt sig: 1 tab bid prn  Please Advise

## 2020-08-01 ENCOUNTER — Ambulatory Visit: Payer: BC Managed Care – PPO | Admitting: Family Medicine

## 2020-08-06 ENCOUNTER — Encounter: Payer: BC Managed Care – PPO | Admitting: Family Medicine

## 2020-08-27 ENCOUNTER — Encounter: Payer: Self-pay | Admitting: Family Medicine

## 2020-09-26 ENCOUNTER — Other Ambulatory Visit: Payer: Self-pay

## 2020-09-27 ENCOUNTER — Other Ambulatory Visit: Payer: Self-pay

## 2020-09-27 ENCOUNTER — Ambulatory Visit (INDEPENDENT_AMBULATORY_CARE_PROVIDER_SITE_OTHER): Payer: BC Managed Care – PPO | Admitting: Family Medicine

## 2020-09-27 ENCOUNTER — Encounter: Payer: Self-pay | Admitting: Family Medicine

## 2020-09-27 VITALS — BP 128/48 | HR 75 | Temp 98.3°F | Resp 16 | Ht 66.0 in | Wt 125.0 lb

## 2020-09-27 DIAGNOSIS — F419 Anxiety disorder, unspecified: Secondary | ICD-10-CM | POA: Diagnosis not present

## 2020-09-27 DIAGNOSIS — Z1159 Encounter for screening for other viral diseases: Secondary | ICD-10-CM

## 2020-09-27 DIAGNOSIS — Z0000 Encounter for general adult medical examination without abnormal findings: Secondary | ICD-10-CM | POA: Diagnosis not present

## 2020-09-27 LAB — CBC WITH DIFFERENTIAL/PLATELET
Basophils Absolute: 0.1 10*3/uL (ref 0.0–0.1)
Basophils Relative: 1.3 % (ref 0.0–3.0)
Eosinophils Absolute: 0.1 10*3/uL (ref 0.0–0.7)
Eosinophils Relative: 1.2 % (ref 0.0–5.0)
HCT: 40 % (ref 36.0–46.0)
Hemoglobin: 13.6 g/dL (ref 12.0–15.0)
Lymphocytes Relative: 31.3 % (ref 12.0–46.0)
Lymphs Abs: 2 10*3/uL (ref 0.7–4.0)
MCHC: 33.9 g/dL (ref 30.0–36.0)
MCV: 94.4 fl (ref 78.0–100.0)
Monocytes Absolute: 0.4 10*3/uL (ref 0.1–1.0)
Monocytes Relative: 6.7 % (ref 3.0–12.0)
Neutro Abs: 3.8 10*3/uL (ref 1.4–7.7)
Neutrophils Relative %: 59.5 % (ref 43.0–77.0)
Platelets: 293 10*3/uL (ref 150.0–400.0)
RBC: 4.24 Mil/uL (ref 3.87–5.11)
RDW: 13.7 % (ref 11.5–15.5)
WBC: 6.3 10*3/uL (ref 4.0–10.5)

## 2020-09-27 LAB — COMPREHENSIVE METABOLIC PANEL
ALT: 10 U/L (ref 0–35)
AST: 16 U/L (ref 0–37)
Albumin: 4.5 g/dL (ref 3.5–5.2)
Alkaline Phosphatase: 47 U/L (ref 39–117)
BUN: 10 mg/dL (ref 6–23)
CO2: 30 mEq/L (ref 19–32)
Calcium: 9.8 mg/dL (ref 8.4–10.5)
Chloride: 100 mEq/L (ref 96–112)
Creatinine, Ser: 0.67 mg/dL (ref 0.40–1.20)
GFR: 102.82 mL/min (ref >60.00)
Glucose, Bld: 80 mg/dL (ref 70–99)
Potassium: 3.8 mEq/L (ref 3.5–5.1)
Sodium: 136 mEq/L (ref 135–145)
Total Bilirubin: 0.9 mg/dL (ref 0.2–1.2)
Total Protein: 7.2 g/dL (ref 6.0–8.3)

## 2020-09-27 LAB — LIPID PANEL
Cholesterol: 212 mg/dL — ABNORMAL HIGH (ref 0–200)
HDL: 130.4 mg/dL (ref >39.00)
LDL Cholesterol: 67 mg/dL (ref 0–99)
NonHDL: 81.34
Total CHOL/HDL Ratio: 2
Triglycerides: 70 mg/dL (ref 0.0–149.0)
VLDL: 14 mg/dL (ref 0.0–40.0)

## 2020-09-27 LAB — TSH: TSH: 1.06 u[IU]/mL (ref 0.35–4.50)

## 2020-09-27 LAB — VITAMIN B12: Vitamin B-12: 466 pg/mL (ref 211–911)

## 2020-09-27 MED ORDER — ALPRAZOLAM 0.25 MG PO TABS: 0.2500 mg | ORAL_TABLET | Freq: Two times a day (BID) | ORAL | 1 refills | Status: DC | PRN

## 2020-09-27 NOTE — Patient Instructions (Signed)
Preventive Care 84-49 Years Old, Female Preventive care refers to lifestyle choices and visits with your health care provider that can promote health and wellness. This includes:  A yearly physical exam. This is also called an annual wellness visit.  Regular dental and eye exams.  Immunizations.  Screening for certain conditions.  Healthy lifestyle choices, such as: ? Eating a healthy diet. ? Getting regular exercise. ? Not using drugs or products that contain nicotine and tobacco. ? Limiting alcohol use. What can I expect for my preventive care visit? Physical exam Your health care provider will check your:  Height and weight. These may be used to calculate your BMI (body mass index). BMI is a measurement that tells if you are at a healthy weight.  Heart rate and blood pressure.  Body temperature.  Skin for abnormal spots. Counseling Your health care provider may ask you questions about your:  Past medical problems.  Family's medical history.  Alcohol, tobacco, and drug use.  Emotional well-being.  Home life and relationship well-being.  Sexual activity.  Diet, exercise, and sleep habits.  Work and work Statistician.  Access to firearms.  Method of birth control.  Menstrual cycle.  Pregnancy history. What immunizations do I need? Vaccines are usually given at various ages, according to a schedule. Your health care provider will recommend vaccines for you based on your age, medical history, and lifestyle or other factors, such as travel or where you work.   What tests do I need? Blood tests  Lipid and cholesterol levels. These may be checked every 5 years, or more often if you are over 3 years old.  Hepatitis C test.  Hepatitis B test. Screening  Lung cancer screening. You may have this screening every year starting at age 73 if you have a 30-pack-year history of smoking and currently smoke or have quit within the past 15 years.  Colorectal cancer  screening. ? All adults should have this screening starting at age 52 and continuing until age 17. ? Your health care provider may recommend screening at age 49 if you are at increased risk. ? You will have tests every 1-10 years, depending on your results and the type of screening test.  Diabetes screening. ? This is done by checking your blood sugar (glucose) after you have not eaten for a while (fasting). ? You may have this done every 1-3 years.  Mammogram. ? This may be done every 1-2 years. ? Talk with your health care provider about when you should start having regular mammograms. This may depend on whether you have a family history of breast cancer.  BRCA-related cancer screening. This may be done if you have a family history of breast, ovarian, tubal, or peritoneal cancers.  Pelvic exam and Pap test. ? This may be done every 3 years starting at age 10. ? Starting at age 11, this may be done every 5 years if you have a Pap test in combination with an HPV test. Other tests  STD (sexually transmitted disease) testing, if you are at risk.  Bone density scan. This is done to screen for osteoporosis. You may have this scan if you are at high risk for osteoporosis. Talk with your health care provider about your test results, treatment options, and if necessary, the need for more tests. Follow these instructions at home: Eating and drinking  Eat a diet that includes fresh fruits and vegetables, whole grains, lean protein, and low-fat dairy products.  Take vitamin and mineral supplements  as recommended by your health care provider.  Do not drink alcohol if: ? Your health care provider tells you not to drink. ? You are pregnant, may be pregnant, or are planning to become pregnant.  If you drink alcohol: ? Limit how much you have to 0-1 drink a day. ? Be aware of how much alcohol is in your drink. In the [place] one drink equals one 12 oz bottle of beer (355 mL), one 5 oz glass of  wine (148 mL), or one 1 oz glass of hard liquor (44 mL).   Lifestyle  Take daily care of your teeth and gums. Brush your teeth every morning and night with fluoride toothpaste. Floss one time each day.  Stay active. Exercise for at least 30 minutes 5 or more days each week.  Do not use any products that contain nicotine or tobacco, such as cigarettes, e-cigarettes, and chewing tobacco. If you need help quitting, ask your health care provider.  Do not use drugs.  If you are sexually active, practice safe sex. Use a condom or other form of protection to prevent STIs (sexually transmitted infections).  If you do not wish to become pregnant, use a form of birth control. If you plan to become pregnant, see your health care provider for a prepregnancy visit.  If told by your health care provider, take low-dose aspirin daily starting at age 4.  Find healthy ways to cope with stress, such as: ? Meditation, yoga, or listening to music. ? Journaling. ? Talking to a trusted person. ? Spending time with friends and family. Safety  Always wear your seat belt while driving or riding in a vehicle.  Do not drive: ? If you have been drinking alcohol. Do not ride with someone who has been drinking. ? When you are tired or distracted. ? While texting.  Wear a helmet and other protective equipment during sports activities.  If you have firearms in your house, make sure you follow all gun safety procedures. What's next?  Visit your health care provider once a year for an annual wellness visit.  Ask your health care provider how often you should have your eyes and teeth checked.  Stay up to date on all vaccines. This information is not intended to replace advice given to you by your health care provider. Make sure you discuss any questions you have with your health care provider. Document Revised: 03/12/2020 Document Reviewed: 02/17/2018 Elsevier Patient Education  2021 [employer].

## 2020-09-27 NOTE — Assessment & Plan Note (Signed)
ghm utd Check labs  See avs  

## 2020-09-27 NOTE — Progress Notes (Signed)
Patient ID: Diane Johnston, female    DOB: 23-Jul-1970  Age: 50 y.o. MRN: [id number]    Subjective:  Subjective  HPI Diane Johnston presents for a comprehensive physical examination today. She complains of stress. She states that she is having difficulty at her job with one of her co-worker. She notes that she has not been taking 0.25 mg Xanax PO Daily regularly. However, she is managing her stress well and when she have a stressful episode, she will take Xanax to relieve her symptoms. She states that she has not been active as before. She denies any chest pain, SOB, fever, abdominal pain, cough, chills, sore throat, dysuria, urinary incontinence, back pain, HA, or N/V/D at this time.  Review of Systems  Constitutional: Negative for chills, fatigue and fever.  HENT: Negative for ear pain, sinus pressure, sinus pain and sore throat.   Eyes: Negative for pain.  Respiratory: Negative for cough and shortness of breath.   Cardiovascular: Negative for chest pain, palpitations and leg swelling.  Gastrointestinal: Negative for abdominal pain, blood in stool, constipation, diarrhea, nausea and vomiting.  Genitourinary: Negative for dysuria, frequency, hematuria and urgency.  Musculoskeletal: Negative for back pain.  Neurological: Negative for headaches.  Psychiatric/Behavioral:       (+) stress secondary to work      History Past Medical History:  Diagnosis Date  . Anxiety   . H/O: vasectomy    HUSBAND HAS HAD VASECTOMY    She has a past surgical history that includes Tonsillectomy and adenoidectomy (1980) and Augmentation mammaplasty (2010).   Her family history includes Hypertension in her paternal grandmother; Osteoporosis in her maternal grandmother.She reports that she quit smoking about 3 years ago. Her smoking use included cigarettes. She has a 0.08 pack-year smoking history. She has never used smokeless tobacco. She reports current alcohol use of about 1.0 standard drink of alcohol per week.  She reports that she does not use drugs.  Current Outpatient Medications on File Prior to Visit  Medication Sig Dispense Refill  . ibuprofen (ADVIL,MOTRIN) 200 MG tablet Take 400-800 mg by mouth every 4 (four) hours as needed for fever, headache, mild pain, moderate pain or cramping.     . Multiple Vitamin (MULTIVITAMIN) tablet Take 1 tablet by mouth daily.     No current facility-administered medications on file prior to visit.     Objective:  Objective  Physical Exam Vitals and nursing note reviewed.  Constitutional:      General: She is not in acute distress.    Appearance: Normal appearance. She is well-developed. She is not ill-appearing.  HENT:     Head: Normocephalic and atraumatic.     Right Ear: Tympanic membrane, ear canal and external ear normal.     Left Ear: Tympanic membrane, ear canal and external ear normal.     Nose: Nose normal.  Eyes:     Extraocular Movements: Extraocular movements intact.     Pupils: Pupils are equal, round, and reactive to light.  Cardiovascular:     Rate and Rhythm: Normal rate and regular rhythm.     Pulses: Normal pulses.     Heart sounds: Normal heart sounds. No murmur heard. No friction rub. No gallop.   Pulmonary:     Effort: Pulmonary effort is normal. No respiratory distress.     Breath sounds: Normal breath sounds. No wheezing, rhonchi or rales.  Abdominal:     General: Bowel sounds are normal. There is no distension.  Palpations: Abdomen is soft.     Tenderness: There is no abdominal tenderness. There is no guarding.     Hernia: No hernia is present.  Musculoskeletal:        General: Normal range of motion.     Cervical back: Normal range of motion and neck supple.  Skin:    General: Skin is warm and dry.  Neurological:     Mental Status: She is alert and oriented to person, place, and time.  Psychiatric:        Behavior: Behavior normal.        Thought Content: Thought content normal.    BP (!) 128/48 (BP Location:  Right Arm, Patient Position: Sitting, Cuff Size: Small)   Pulse 75   Temp 98.3 F (36.8 C) (Oral)   Resp 16   Ht 5\' 6"  (1.676 m)   Wt 125 lb (56.7 kg)   SpO2 100%   BMI 20.18 kg/m  Wt Readings from Last 3 Encounters:  09/27/20 125 lb (56.7 kg)  09/15/19 122 lb 6.4 oz (55.5 kg)  05/22/19 129 lb 6.4 oz (58.7 kg)     Lab Results  Component Value Date   WBC 5.8 04/29/2018   HGB 13.9 04/29/2018   HCT 41.1 04/29/2018   PLT 304.0 04/29/2018   GLUCOSE 79 04/29/2018   CHOL 216 (H) 04/18/2012   ALT 8 04/29/2018   AST 14 04/29/2018   NA 140 04/29/2018   K 4.2 04/29/2018   CL 103 04/29/2018   CREATININE 0.70 04/29/2018   BUN 8 04/29/2018   CO2 30 04/29/2018   TSH 1.13 04/29/2018    DG Chest 2 View  Result Date: 12/12/2017 CLINICAL DATA:  Cough and fever.  Flu symptoms. EXAM: CHEST - 2 VIEW COMPARISON:  01/11/2017 FINDINGS: The cardiac silhouette is normal in size. There is new abnormal right hilar density with streaky opacity extending inferiorly from the hilum. Right basilar airspace opacity and volume loss are present. The left lung is clear. No pleural effusion or pneumothorax is identified. Nipple shadows are noted bilaterally. No acute osseous abnormality is seen. IMPRESSION: Abnormal right hilar opacity and right lower lung airspace disease, most likely pneumonia in this setting. Followup PA and lateral chest X-ray is recommended in 3-4 weeks following trial of antibiotic therapy to ensure resolution and exclude underlying malignancy/hilar mass. Electronically Signed   By: 01/13/2017 M.D.   On: 12/12/2017 15:36     Assessment & Plan:  Plan   Meds ordered this encounter  Medications  . ALPRAZolam (XANAX) 0.25 MG tablet    Sig: Take 1 tablet (0.25 mg total) by mouth 2 (two) times daily as needed for anxiety.    Dispense:  30 tablet    Refill:  1    Problem List Items Addressed This Visit      Unprioritized   Anxiety    Stable con't prn xanax      Relevant  Medications   ALPRAZolam (XANAX) 0.25 MG tablet   Need for hepatitis C screening test   Relevant Orders   Hepatitis C antibody   Preventative health care - Primary    ghm utd Check labs  See avs      Relevant Orders   Lipid panel   CBC with Differential/Platelet   TSH   Vitamin B12   Comprehensive metabolic panel      Follow-up: Return in about 1 year (around 09/27/2021), or if symptoms worsen or fail to improve, for annual exam, fasting.  Mammo: Last completed on 05/26/2012, results were normal, repeat every 1 year.   Pap Smear: Last completed on 01/06/2011 , fungal organisms noted , repeat every 3 years.   I,Gordon Zheng,acting as a Neurosurgeon for [employer], DO.,have documented all relevant documentation on the behalf of Donato Schultz, DO,as directed by  Donato Schultz, DO while in the presence of Donato Schultz, DO.  I, Donato Schultz, DO, have reviewed all documentation for this visit. The documentation on 09/27/20 for the exam, diagnosis, procedures, and orders are all accurate and complete.

## 2020-09-27 NOTE — Assessment & Plan Note (Signed)
Stable  con't prn xanax  

## 2020-09-30 LAB — HEPATITIS C ANTIBODY
Hepatitis C Ab: NONREACTIVE
SIGNAL TO CUT-OFF: 0.01 (ref <1.00)

## 2020-11-15 ENCOUNTER — Encounter: Payer: Self-pay | Admitting: Family Medicine

## 2020-11-15 DIAGNOSIS — F419 Anxiety disorder, unspecified: Secondary | ICD-10-CM

## 2020-11-15 MED ORDER — ALPRAZOLAM 0.25 MG PO TABS: 0.2500 mg | ORAL_TABLET | Freq: Two times a day (BID) | ORAL | 1 refills | Status: DC | PRN

## 2020-11-15 NOTE — Telephone Encounter (Signed)
Requesting: Xanax Contract: 09/15/2019 UDS: 09/15/2019, low risk Last OV:  09/27/2020 Next OV: N/A Last Refill: 09/27/2020, #30--1 RF Database:   Please advise

## 2021-01-27 ENCOUNTER — Other Ambulatory Visit: Payer: Self-pay | Admitting: Family Medicine

## 2021-01-27 DIAGNOSIS — F419 Anxiety disorder, unspecified: Secondary | ICD-10-CM

## 2021-01-27 NOTE — Telephone Encounter (Signed)
Requesting: alprazolam 0.25mg  Contract: 09/15/2019 UDS: 09/15/2019 Last Visit: 09/27/2020 Next Visit: None Last Refill: 11/15/2020 #30 and 1RF  Please Advise

## 2021-05-29 ENCOUNTER — Encounter: Payer: Self-pay | Admitting: Family Medicine

## 2021-05-29 DIAGNOSIS — F419 Anxiety disorder, unspecified: Secondary | ICD-10-CM

## 2021-05-29 MED ORDER — ALPRAZOLAM 0.25 MG PO TABS: 0.2500 mg | ORAL_TABLET | Freq: Two times a day (BID) | ORAL | 1 refills | Status: DC | PRN

## 2021-05-29 NOTE — Telephone Encounter (Signed)
Requesting: Xanax Contract: 2021 UDS: 2021 Last OV: 09/27/2020 Next OV: N/A Last Refill: 01/27/21, #30--1 RF Database:   Please advise

## 2021-07-09 ENCOUNTER — Other Ambulatory Visit: Payer: Self-pay | Admitting: Family Medicine

## 2021-07-09 DIAGNOSIS — Z30011 Encounter for initial prescription of contraceptive pills: Secondary | ICD-10-CM

## 2021-07-09 MED ORDER — NORETHIN ACE-ETH ESTRAD-FE 1-20 MG-MCG PO TABS: 1.0000 | ORAL_TABLET | Freq: Every day | ORAL | 11 refills | Status: DC

## 2021-09-05 ENCOUNTER — Telehealth: Payer: Self-pay | Admitting: *Deleted

## 2021-09-05 ENCOUNTER — Telehealth: Payer: BC Managed Care – PPO | Admitting: Physician Assistant

## 2021-09-05 DIAGNOSIS — N760 Acute vaginitis: Secondary | ICD-10-CM

## 2021-09-05 MED ORDER — METRONIDAZOLE 500 MG PO TABS: 500.0000 mg | ORAL_TABLET | Freq: Two times a day (BID) | ORAL | 0 refills | Status: DC

## 2021-09-05 MED ORDER — METRONIDAZOLE 500 MG PO TABS: 500.0000 mg | ORAL_TABLET | Freq: Two times a day (BID) | ORAL | 0 refills | Status: AC

## 2021-09-05 NOTE — Progress Notes (Signed)

## 2021-09-05 NOTE — Telephone Encounter (Signed)
Pt called regarding pharmacy not receiving Rx as stated on After Visit Summary (AVS).  RNCM called pharmacy to confirm that they have received it and are working on filling.  RNCM returned call to pt and advised that pharmacy has Rx. ? ?

## 2021-09-05 NOTE — Addendum Note (Signed)
Addended by: Karrie Meres on: 09/05/2021 10:34 AM ? ? Modules accepted: Orders ? ?

## 2021-09-08 ENCOUNTER — Encounter: Payer: Self-pay | Admitting: Family Medicine

## 2021-09-08 DIAGNOSIS — F419 Anxiety disorder, unspecified: Secondary | ICD-10-CM

## 2021-09-08 MED ORDER — ALPRAZOLAM 0.25 MG PO TABS: 0.2500 mg | ORAL_TABLET | Freq: Two times a day (BID) | ORAL | 1 refills | Status: DC | PRN

## 2021-09-08 NOTE — Telephone Encounter (Signed)
Requesting: Xanax ?Contract: 09/15/2019 ?UDS: 09/15/2019 ?Last OV: 09/27/2020 ?Next OV: N/A ?Last Refill: 05/29/2021, #30--1 RF ?Database: ? ? ?Please advise  ? ?

## 2022-01-29 ENCOUNTER — Encounter: Payer: Self-pay | Admitting: Family Medicine

## 2022-01-29 DIAGNOSIS — F419 Anxiety disorder, unspecified: Secondary | ICD-10-CM

## 2022-01-30 ENCOUNTER — Other Ambulatory Visit: Payer: Self-pay | Admitting: Family Medicine

## 2022-01-30 DIAGNOSIS — F419 Anxiety disorder, unspecified: Secondary | ICD-10-CM

## 2022-01-30 MED ORDER — ALPRAZOLAM 0.25 MG PO TABS: 0.2500 mg | ORAL_TABLET | Freq: Two times a day (BID) | ORAL | 1 refills | Status: DC | PRN

## 2022-01-30 NOTE — Telephone Encounter (Signed)
Requesting: Xanax Contract: 09/15/2019 UDS: 09/15/2019 Last OV: 09/27/2020 Next OV: N/A Last Refill: 3/20/0/23, #30--1 RF Database:     Please advise

## 2022-02-09 ENCOUNTER — Encounter: Payer: Self-pay | Admitting: Family Medicine

## 2022-03-27 ENCOUNTER — Ambulatory Visit: Payer: BC Managed Care – PPO | Admitting: Family Medicine

## 2022-04-13 ENCOUNTER — Encounter: Payer: Self-pay | Admitting: Family Medicine

## 2022-04-13 ENCOUNTER — Ambulatory Visit (INDEPENDENT_AMBULATORY_CARE_PROVIDER_SITE_OTHER): Payer: BC Managed Care – PPO | Admitting: Family Medicine

## 2022-04-13 VITALS — BP 98/80 | HR 83 | Temp 98.3°F | Resp 16 | Ht 66.0 in | Wt 120.0 lb

## 2022-04-13 DIAGNOSIS — Z0000 Encounter for general adult medical examination without abnormal findings: Secondary | ICD-10-CM

## 2022-04-13 DIAGNOSIS — F419 Anxiety disorder, unspecified: Secondary | ICD-10-CM

## 2022-04-13 DIAGNOSIS — Z1211 Encounter for screening for malignant neoplasm of colon: Secondary | ICD-10-CM

## 2022-04-13 DIAGNOSIS — Z79899 Other long term (current) drug therapy: Secondary | ICD-10-CM | POA: Diagnosis not present

## 2022-04-13 LAB — CBC WITH DIFFERENTIAL/PLATELET
Basophils Absolute: 0.1 10*3/uL (ref 0.0–0.1)
Basophils Relative: 1.1 % (ref 0.0–3.0)
Eosinophils Absolute: 0.1 10*3/uL (ref 0.0–0.7)
Eosinophils Relative: 0.9 % (ref 0.0–5.0)
HCT: 39.7 % (ref 36.0–46.0)
Hemoglobin: 13.2 g/dL (ref 12.0–15.0)
Lymphocytes Relative: 25.6 % (ref 12.0–46.0)
Lymphs Abs: 1.6 10*3/uL (ref 0.7–4.0)
MCHC: 33.3 g/dL (ref 30.0–36.0)
MCV: 95.3 fl (ref 78.0–100.0)
Monocytes Absolute: 0.4 10*3/uL (ref 0.1–1.0)
Monocytes Relative: 6.2 % (ref 3.0–12.0)
Neutro Abs: 4.2 10*3/uL (ref 1.4–7.7)
Neutrophils Relative %: 66.2 % (ref 43.0–77.0)
Platelets: 332 10*3/uL (ref 150.0–400.0)
RBC: 4.16 Mil/uL (ref 3.87–5.11)
RDW: 13.5 % (ref 11.5–15.5)
WBC: 6.4 10*3/uL (ref 4.0–10.5)

## 2022-04-13 LAB — LIPID PANEL
Cholesterol: 203 mg/dL — ABNORMAL HIGH (ref 0–200)
HDL: 117.7 mg/dL (ref >39.00)
LDL Cholesterol: 76 mg/dL (ref 0–99)
NonHDL: 85.35
Total CHOL/HDL Ratio: 2
Triglycerides: 46 mg/dL (ref 0.0–149.0)
VLDL: 9.2 mg/dL (ref 0.0–40.0)

## 2022-04-13 LAB — COMPREHENSIVE METABOLIC PANEL
ALT: 8 U/L (ref 0–35)
AST: 14 U/L (ref 0–37)
Albumin: 4.5 g/dL (ref 3.5–5.2)
Alkaline Phosphatase: 40 U/L (ref 39–117)
BUN: 10 mg/dL (ref 6–23)
CO2: 30 mEq/L (ref 19–32)
Calcium: 9.8 mg/dL (ref 8.4–10.5)
Chloride: 102 mEq/L (ref 96–112)
Creatinine, Ser: 0.6 mg/dL (ref 0.40–1.20)
GFR: 104.45 mL/min (ref >60.00)
Glucose, Bld: 89 mg/dL (ref 70–99)
Potassium: 4.1 mEq/L (ref 3.5–5.1)
Sodium: 139 mEq/L (ref 135–145)
Total Bilirubin: 0.6 mg/dL (ref 0.2–1.2)
Total Protein: 6.8 g/dL (ref 6.0–8.3)

## 2022-04-13 LAB — TSH: TSH: 1.46 u[IU]/mL (ref 0.35–5.50)

## 2022-04-13 LAB — VITAMIN B12: Vitamin B-12: 565 pg/mL (ref 211–911)

## 2022-04-13 MED ORDER — ALPRAZOLAM 0.25 MG PO TABS: 0.2500 mg | ORAL_TABLET | Freq: Two times a day (BID) | ORAL | 1 refills | Status: DC | PRN

## 2022-04-13 NOTE — Patient Instructions (Addendum)
Preventive Care 40-50 Years Old, Female Preventive care refers to lifestyle choices and visits with your health care provider that can promote health and wellness. Preventive care visits are also called wellness exams. What can I expect for my preventive care visit? Counseling Your health care provider may ask you questions about your: Medical history, including: Past medical problems. Family medical history. Pregnancy history. Current health, including: Menstrual cycle. Method of birth control. Emotional well-being. Home life and relationship well-being. Sexual activity and sexual health. Lifestyle, including: Alcohol, nicotine or tobacco, and drug use. Access to firearms. Diet, exercise, and sleep habits. Work and work environment. Sunscreen use. Safety issues such as seatbelt and bike helmet use. Physical exam Your health care provider will check your: Height and weight. These may be used to calculate your BMI (body mass index). BMI is a measurement that tells if you are at a healthy weight. Waist circumference. This measures the distance around your waistline. This measurement also tells if you are at a healthy weight and may help predict your risk of certain diseases, such as type 2 diabetes and high blood pressure. Heart rate and blood pressure. Body temperature. Skin for abnormal spots. What immunizations do I need?  Vaccines are usually given at various ages, according to a schedule. Your health care provider will recommend vaccines for you based on your age, medical history, and lifestyle or other factors, such as travel or where you work. What tests do I need? Screening Your health care provider may recommend screening tests for certain conditions. This may include: Lipid and cholesterol levels. Diabetes screening. This is done by checking your blood sugar (glucose) after you have not eaten for a while (fasting). Pelvic exam and Pap test. Hepatitis B test. Hepatitis C  test. HIV (human immunodeficiency virus) test. STI (sexually transmitted infection) testing, if you are at risk. Lung cancer screening. Colorectal cancer screening. Mammogram. Talk with your health care provider about when you should start having regular mammograms. This may depend on whether you have a family history of breast cancer. BRCA-related cancer screening. This may be done if you have a family history of breast, ovarian, tubal, or peritoneal cancers. Bone density scan. This is done to screen for osteoporosis. Talk with your health care provider about your test results, treatment options, and if necessary, the need for more tests. Follow these instructions at home: Eating and drinking  Eat a diet that includes fresh fruits and vegetables, whole grains, lean protein, and low-fat dairy products. Take vitamin and mineral supplements as recommended by your health care provider. Do not drink alcohol if: Your health care provider tells you not to drink. You are pregnant, may be pregnant, or are planning to become pregnant. If you drink alcohol: Limit how much you have to 0-1 drink a day. Know how much alcohol is in your drink. In the [place] one drink equals one 12 oz bottle of beer (355 mL), one 5 oz glass of wine (148 mL), or one 1 oz glass of hard liquor (44 mL). Lifestyle Brush your teeth every morning and night with fluoride toothpaste. Floss one time each day. Exercise for at least 30 minutes 5 or more days each week. Do not use any products that contain nicotine or tobacco. These products include cigarettes, chewing tobacco, and vaping devices, such as e-cigarettes. If you need help quitting, ask your health care provider. Do not use drugs. If you are sexually active, practice safe sex. Use a condom or other form of protection to   prevent STIs. If you do not wish to become pregnant, use a form of birth control. If you plan to become pregnant, see your health care provider for a  prepregnancy visit. Take aspirin only as told by your health care provider. Make sure that you understand how much to take and what form to take. Work with your health care provider to find out whether it is safe and beneficial for you to take aspirin daily. Find healthy ways to manage stress, such as: Meditation, yoga, or listening to music. Journaling. Talking to a trusted person. Spending time with friends and family. Minimize exposure to UV radiation to reduce your risk of skin cancer. Safety Always wear your seat belt while driving or riding in a vehicle. Do not drive: If you have been drinking alcohol. Do not ride with someone who has been drinking. When you are tired or distracted. While texting. If you have been using any mind-altering substances or drugs. Wear a helmet and other protective equipment during sports activities. If you have firearms in your house, make sure you follow all gun safety procedures. Seek help if you have been physically or sexually abused. What's next? Visit your health care provider once a year for an annual wellness visit. Ask your health care provider how often you should have your eyes and teeth checked. Stay up to date on all vaccines. This information is not intended to replace advice given to you by your health care provider. Make sure you discuss any questions you have with your health care provider. Document Revised: 12/04/2020 Document Reviewed: 12/04/2020 Elsevier Patient Education  [place]

## 2022-04-13 NOTE — Assessment & Plan Note (Signed)
stable °

## 2022-04-13 NOTE — Assessment & Plan Note (Signed)
Flu shot given at work Colon --- referral placed She wants to hold off on shingles vaccine See avs  Check labs

## 2022-04-13 NOTE — Progress Notes (Signed)
Subjective:   By signing my name below, I, Cassell Clement, attest that this documentation has been prepared under the direction and in the presence of Donato Schultz DO 04/13/2022   Patient ID: Diane Johnston, female    DOB: 04-01-1971, 51 y.o.   MRN: [id number]  Chief Complaint  Patient presents with   Anxiety   Follow-up   Annual Exam    HPI Patient is in today for an office visit   She is requesting a refill of 0.25 Mg of Xanax  She is interested in receiving blood work to check her B12 levels.  Lab Results  Component Value Date   VITAMINB12 466 09/27/2020   She denies having any fever, new muscle pain, joint pain , new moles, congestion, sinus pain, sore throat, chest pain, palpations, cough, SOB ,wheezing,n/v/d constipation, blood in stool, dysuria, frequency, hematuria, at this time  She has not received a colonoscopy.  Pap Smear last completed on 01/06/2011 Mammogram last completed on 05/27/2011 She reports that she is getting her influenza vaccine at [facility]. She is not interested in receiving a Shingles vaccine during today's visit.   Past Medical History:  Diagnosis Date   Anxiety    H/O: vasectomy    HUSBAND HAS HAD VASECTOMY    Past Surgical History:  Procedure Laterality Date   AUGMENTATION MAMMAPLASTY  2010   SILICONE   TONSILLECTOMY AND ADENOIDECTOMY  1980    Family History  Problem Relation Age of Onset   Osteoporosis Maternal Grandmother    Hypertension Paternal Grandmother     Social History   Socioeconomic History   Marital status: Divorced    Spouse name: Not on file   Number of children: Not on file   Years of education: Not on file   Highest education level: Not on file  Occupational History   Not on file  Tobacco Use   Smoking status: Former    Packs/day: 0.04    Years: 2.00    Total pack years: 0.08    Types: Cigarettes    Quit date: 11/2016    Years since quitting: 5.3   Smokeless tobacco: Never   Tobacco comments:    pt  only ever smoked occassionally  never daily   Vaping Use   Vaping Use: Never used  Substance and Sexual Activity   Alcohol use: Yes    Alcohol/week: 1.0 standard drink of alcohol    Types: 1 Shots of liquor per week    Comment: rarely   Drug use: No   Sexual activity: Yes  Other Topics Concern   Not on file  Social History Narrative   Not on file   Social Determinants of Health   Financial Resource Strain: Not on file  Food Insecurity: Not on file  Transportation Needs: Not on file  Physical Activity: Not on file  Stress: Not on file  Social Connections: Not on file  Intimate Partner Violence: Not on file    Outpatient Medications Prior to Visit  Medication Sig Dispense Refill   ibuprofen (ADVIL,MOTRIN) 200 MG tablet Take 400-800 mg by mouth every 4 (four) hours as needed for fever, headache, mild pain, moderate pain or cramping.      Multiple Vitamin (MULTIVITAMIN) tablet Take 1 tablet by mouth daily.     norethindrone-ethinyl estradiol-FE (LOESTRIN FE 1/20) 1-20 MG-MCG tablet Take 1 tablet by mouth daily. 28 tablet 11   ALPRAZolam (XANAX) 0.25 MG tablet Take 1 tablet (0.25 mg total) by mouth 2 (  two) times daily as needed for anxiety. 30 tablet 1   No facility-administered medications prior to visit.    Allergies  Allergen Reactions   Penicillins Rash    Has patient had a PCN reaction causing immediate rash, facial/tongue/throat swelling, SOB or lightheadedness with hypotension: Yes Has patient had a PCN reaction causing severe rash involving mucus membranes or skin necrosis: No Has patient had a PCN reaction that required hospitalization No Has patient had a PCN reaction occurring within the last 10 years: No If all of the above answers are "NO", then may proceed with Cephalosporin use.     Review of Systems  Constitutional:  Negative for fever and malaise/fatigue.  HENT:  Negative for congestion.   Eyes:  Negative for blurred vision.  Respiratory:  Negative for  shortness of breath.   Cardiovascular:  Negative for chest pain, palpitations and leg swelling.  Gastrointestinal:  Negative for abdominal pain, blood in stool and nausea.  Genitourinary:  Negative for dysuria and frequency.  Musculoskeletal:  Negative for falls.  Skin:  Negative for rash.  Neurological:  Negative for dizziness, loss of consciousness and headaches.  Endo/Heme/Allergies:  Negative for environmental allergies.  Psychiatric/Behavioral:  Negative for depression. The patient is nervous/anxious.        Objective:    Physical Exam Vitals and nursing note reviewed.  Constitutional:      General: She is not in acute distress.    Appearance: Normal appearance. She is well-developed. She is not ill-appearing.  HENT:     Head: Normocephalic and atraumatic.     Right Ear: External ear normal.     Left Ear: External ear normal.     Nose: Mucosal edema and rhinorrhea present. No nasal deformity.     Right Sinus: Maxillary sinus tenderness and frontal sinus tenderness present.     Left Sinus: Maxillary sinus tenderness and frontal sinus tenderness present.     Mouth/Throat:     Pharynx: No oropharyngeal exudate.  Eyes:     Extraocular Movements: Extraocular movements intact.     Pupils: Pupils are equal, round, and reactive to light.  Cardiovascular:     Rate and Rhythm: Normal rate and regular rhythm.     Heart sounds: Normal heart sounds. No murmur heard.    No gallop.  Pulmonary:     Effort: Pulmonary effort is normal. No respiratory distress.     Breath sounds: Normal breath sounds. No wheezing or rales.  Chest:     Chest wall: No tenderness.  Musculoskeletal:     Cervical back: Normal range of motion and neck supple.  Lymphadenopathy:     Cervical: No cervical adenopathy.  Skin:    General: Skin is warm and dry.  Neurological:     Mental Status: She is alert and oriented to person, place, and time.  Psychiatric:        Attention and Perception: Attention and  perception normal.        Mood and Affect: Mood is anxious and depressed. Affect is not labile, flat or tearful.        Speech: Speech normal.        Behavior: Behavior normal. Behavior is cooperative.        Thought Content: Thought content is not paranoid. Thought content does not include homicidal or suicidal ideation. Thought content does not include homicidal or suicidal plan.        Cognition and Memory: Cognition normal.        Judgment: Judgment  normal.     BP 98/80 (BP Location: Right Arm, Patient Position: Sitting, Cuff Size: Normal)   Pulse 83   Temp 98.3 F (36.8 C) (Oral)   Resp 16   Ht 5\' 6"  (1.676 m)   Wt 120 lb (54.4 kg)   SpO2 97%   BMI 19.37 kg/m  Wt Readings from Last 3 Encounters:  04/13/22 120 lb (54.4 kg)  09/27/20 125 lb (56.7 kg)  09/15/19 122 lb 6.4 oz (55.5 kg)    Diabetic Foot Exam - Simple   No data filed    Lab Results  Component Value Date   WBC 6.3 09/27/2020   HGB 13.6 09/27/2020   HCT 40.0 09/27/2020   PLT 293.0 09/27/2020   GLUCOSE 80 09/27/2020   CHOL 212 (H) 09/27/2020   TRIG 70.0 09/27/2020   HDL 130.40 09/27/2020   LDLCALC 67 09/27/2020   ALT 10 09/27/2020   AST 16 09/27/2020   NA 136 09/27/2020   K 3.8 09/27/2020   CL 100 09/27/2020   CREATININE 0.67 09/27/2020   BUN 10 09/27/2020   CO2 30 09/27/2020   TSH 1.06 09/27/2020    Lab Results  Component Value Date   TSH 1.06 09/27/2020   Lab Results  Component Value Date   WBC 6.3 09/27/2020   HGB 13.6 09/27/2020   HCT 40.0 09/27/2020   MCV 94.4 09/27/2020   PLT 293.0 09/27/2020   Lab Results  Component Value Date   NA 136 09/27/2020   K 3.8 09/27/2020   CO2 30 09/27/2020   GLUCOSE 80 09/27/2020   BUN 10 09/27/2020   CREATININE 0.67 09/27/2020   BILITOT 0.9 09/27/2020   ALKPHOS 47 09/27/2020   AST 16 09/27/2020   ALT 10 09/27/2020   PROT 7.2 09/27/2020   ALBUMIN 4.5 09/27/2020   CALCIUM 9.8 09/27/2020   GFR 102.82 09/27/2020   Lab Results  Component  Value Date   CHOL 212 (H) 09/27/2020   Lab Results  Component Value Date   HDL 130.40 09/27/2020   Lab Results  Component Value Date   LDLCALC 67 09/27/2020   Lab Results  Component Value Date   TRIG 70.0 09/27/2020   Lab Results  Component Value Date   CHOLHDL 2 09/27/2020   No results found for: "HGBA1C"     Assessment & Plan:   Problem List Items Addressed This Visit       Unprioritized   Preventative health care - Primary    Flu shot given at work Colon --- referral placed She wants to hold off on shingles vaccine See avs  Check labs       Relevant Orders   CBC with Differential/Platelet   Comprehensive metabolic panel   Lipid panel   Vitamin B12   TSH   Anxiety    stable      Relevant Medications   ALPRAZolam (XANAX) 0.25 MG tablet   Other Visit Diagnoses     High risk medication use       Relevant Orders   Drug Monitoring Panel [phone] , Urine   Colon cancer screening       Relevant Orders   Ambulatory referral to Gastroenterology      Meds ordered this encounter  Medications   ALPRAZolam (XANAX) 0.25 MG tablet    Sig: Take 1 tablet (0.25 mg total) by mouth 2 (two) times daily as needed for anxiety.    Dispense:  30 tablet    Refill:  1  Not to exceed 4 additional fills before 05/14/2021    I, Donato Schultz, DO, personally preformed the services described in this documentation.  All medical record entries made by the scribe were at my direction and in my presence.  I have reviewed the chart and discharge instructions (if applicable) and agree that the record reflects my personal performance and is accurate and complete. 04/13/2022   I,Amber Collins,acting as a scribe for Donato Schultz, DO.,have documented all relevant documentation on the behalf of Donato Schultz, DO,as directed by  Donato Schultz, DO while in the presence of Donato Schultz, DO.    Donato Schultz, DO

## 2022-04-15 LAB — DRUG MONITORING PANEL 376104, URINE
Amphetamines: NEGATIVE ng/mL (ref <500)
Barbiturates: NEGATIVE ng/mL (ref <300)
Benzodiazepines: NEGATIVE ng/mL (ref <100)
Cocaine Metabolite: NEGATIVE ng/mL (ref <150)
Desmethyltramadol: NEGATIVE ng/mL (ref <100)
Opiates: NEGATIVE ng/mL (ref <100)
Oxycodone: NEGATIVE ng/mL (ref <100)
Tramadol: NEGATIVE ng/mL (ref <100)

## 2022-04-15 LAB — DM TEMPLATE

## 2022-07-03 DIAGNOSIS — I493 Ventricular premature depolarization: Secondary | ICD-10-CM | POA: Diagnosis not present

## 2022-07-03 DIAGNOSIS — R079 Chest pain, unspecified: Secondary | ICD-10-CM | POA: Diagnosis not present

## 2022-07-03 DIAGNOSIS — R002 Palpitations: Secondary | ICD-10-CM | POA: Diagnosis not present

## 2022-07-03 DIAGNOSIS — R0602 Shortness of breath: Secondary | ICD-10-CM | POA: Diagnosis not present

## 2022-07-03 DIAGNOSIS — Z800 Family history of malignant neoplasm of digestive organs: Secondary | ICD-10-CM | POA: Diagnosis not present

## 2022-07-03 DIAGNOSIS — R0789 Other chest pain: Secondary | ICD-10-CM | POA: Diagnosis not present

## 2022-07-03 DIAGNOSIS — F410 Panic disorder [episodic paroxysmal anxiety] without agoraphobia: Secondary | ICD-10-CM | POA: Diagnosis not present

## 2022-07-03 DIAGNOSIS — F419 Anxiety disorder, unspecified: Secondary | ICD-10-CM | POA: Diagnosis not present

## 2022-07-03 DIAGNOSIS — F17200 Nicotine dependence, unspecified, uncomplicated: Secondary | ICD-10-CM | POA: Diagnosis not present

## 2022-07-29 ENCOUNTER — Telehealth: Payer: BC Managed Care – PPO | Admitting: Physician Assistant

## 2022-07-29 DIAGNOSIS — R3989 Other symptoms and signs involving the genitourinary system: Secondary | ICD-10-CM

## 2022-07-29 MED ORDER — NITROFURANTOIN MONOHYD MACRO 100 MG PO CAPS: 100.0000 mg | ORAL_CAPSULE | Freq: Two times a day (BID) | ORAL | 0 refills | Status: DC

## 2022-07-29 NOTE — Progress Notes (Signed)
Virtual Visit Consent   Diane Johnston, you are scheduled for a virtual visit with a [facility] provider today. Just as with appointments in the office, your consent must be obtained to participate. Your consent will be active for this visit and any virtual visit you may have with one of our providers in the next 365 days. If you have a MyChart account, a copy of this consent can be sent to you electronically.  As this is a virtual visit, video technology does not allow for your provider to perform a traditional examination. This may limit your provider's ability to fully assess your condition. If your provider identifies any concerns that need to be evaluated in person or the need to arrange testing (such as labs, EKG, etc.), we will make arrangements to do so. Although advances in technology are sophisticated, we cannot ensure that it will always work on either your end or our end. If the connection with a video visit is poor, the visit may have to be switched to a telephone visit. With either a video or telephone visit, we are not always able to ensure that we have a secure connection.  By engaging in this virtual visit, you consent to the provision of healthcare and authorize for your insurance to be billed (if applicable) for the services provided during this visit. Depending on your insurance coverage, you may receive a charge related to this service.  I need to obtain your verbal consent now. Are you willing to proceed with your visit today? Diane Johnston has provided verbal consent on 07/29/2022 for a virtual visit (video or telephone). Leeanne Rio, [place]  Date: 07/29/2022 1:35 PM  Virtual Visit via Video Note   I, Leeanne Rio, connected with  Diane Johnston  ([id number], 01-19-1971) on 07/29/22 at  1:45 PM EST by a video-enabled telemedicine application and verified that I am speaking with the correct person using two identifiers.  Location: Patient: Virtual Visit Location Patient:  Home Provider: Virtual Visit Location Provider: Home Office   I discussed the limitations of evaluation and management by telemedicine and the availability of in person appointments. The patient expressed understanding and agreed to proceed.    History of Present Illness: Diane Johnston is a 52 y.o. who identifies as a female who was assigned female at birth, and is being seen today for possible UTI. Patient endorses symptoms starting yesterday about with urinary urgency and frequency, now with faint blood and dysuria. Denies fever, chills, nausea or vomiting. Denies abdominal pain.  LMP ended last week. Denies vaginal symptoms.   HPI: HPI  Problems:  Patient Active Problem List   Diagnosis Date Noted   Preventative health care 09/27/2020   Need for hepatitis C screening test 09/27/2020   Anxiety 04/29/2018   Fatigue 01/11/2017   Sore throat 01/11/2017   Situational anxiety 11/23/2016   Encounter for IUD insertion 10/31/2012    Allergies:  Allergies  Allergen Reactions   Penicillins Rash    Has patient had a PCN reaction causing immediate rash, facial/tongue/throat swelling, SOB or lightheadedness with hypotension: Yes Has patient had a PCN reaction causing severe rash involving mucus membranes or skin necrosis: No Has patient had a PCN reaction that required hospitalization No Has patient had a PCN reaction occurring within the last 10 years: No If all of the above answers are "NO", then may proceed with Cephalosporin use.    Medications:  Current Outpatient Medications:    nitrofurantoin, macrocrystal-monohydrate, (MACROBID)  100 MG capsule, Take 1 capsule (100 mg total) by mouth 2 (two) times daily., Disp: 10 capsule, Rfl: 0   ALPRAZolam (XANAX) 0.25 MG tablet, Take 1 tablet (0.25 mg total) by mouth 2 (two) times daily as needed for anxiety., Disp: 30 tablet, Rfl: 1   ibuprofen (ADVIL,MOTRIN) 200 MG tablet, Take 400-800 mg by mouth every 4 (four) hours as needed for fever,  headache, mild pain, moderate pain or cramping. , Disp: , Rfl:    Multiple Vitamin (MULTIVITAMIN) tablet, Take 1 tablet by mouth daily., Disp: , Rfl:    norethindrone-ethinyl estradiol-FE (LOESTRIN FE 1/20) 1-20 MG-MCG tablet, Take 1 tablet by mouth daily., Disp: 28 tablet, Rfl: 11  Observations/Objective: Patient is well-developed, well-nourished in no acute distress.  Resting comfortably at home.  Head is normocephalic, atraumatic.  No labored breathing Speech is clear and coherent with logical content.  Patient is alert and oriented at baseline.   Assessment and Plan: 1. Suspected UTI - nitrofurantoin, macrocrystal-monohydrate, (MACROBID) 100 MG capsule; Take 1 capsule (100 mg total) by mouth 2 (two) times daily.  Dispense: 10 capsule; Refill: 0  Classic UTI symptoms with absence of alarm signs or symptoms. Prior history of UTI. Will treat empirically with Macrobid for suspected uncomplicated cystitis. Supportive measures and OTC medications reviewed. Strict in-person evaluation precautions discussed.    Follow Up Instructions: I discussed the assessment and treatment plan with the patient. The patient was provided an opportunity to ask questions and all were answered. The patient agreed with the plan and demonstrated an understanding of the instructions.  A copy of instructions were sent to the patient via MyChart unless otherwise noted below.   The patient was advised to call back or seek an in-person evaluation if the symptoms worsen or if the condition fails to improve as anticipated.  Time:  I spent 10 minutes with the patient via telehealth technology discussing the above problems/concerns.    Leeanne Rio, PA-C

## 2022-07-29 NOTE — Patient Instructions (Addendum)
Delena Bali, thank you for joining Leeanne Rio, PA-C for today's virtual visit.  While this provider is not your primary care provider (PCP), if your PCP is located in our provider database this encounter information will be shared with them immediately following your visit.   [facility] account gives you access to today's visit and all your visits, tests, and labs performed at [facility] " click here if you don't have a [facility] account or go to [email or web address]  Consent: (Patient) Diane Johnston provided verbal consent for this virtual visit at the beginning of the encounter.  Current Medications:  Current Outpatient Medications:    ALPRAZolam (XANAX) 0.25 MG tablet, Take 1 tablet (0.25 mg total) by mouth 2 (two) times daily as needed for anxiety., Disp: 30 tablet, Rfl: 1   ibuprofen (ADVIL,MOTRIN) 200 MG tablet, Take 400-800 mg by mouth every 4 (four) hours as needed for fever, headache, mild pain, moderate pain or cramping. , Disp: , Rfl:    Multiple Vitamin (MULTIVITAMIN) tablet, Take 1 tablet by mouth daily., Disp: , Rfl:    norethindrone-ethinyl estradiol-FE (LOESTRIN FE 1/20) 1-20 MG-MCG tablet, Take 1 tablet by mouth daily., Disp: 28 tablet, Rfl: 11   Medications ordered in this encounter:  No orders of the defined types were placed in this encounter.    *If you need refills on other medications prior to your next appointment, please contact your pharmacy*  Follow-Up: Call back or seek an in-person evaluation if the symptoms worsen or if the condition fails to improve as anticipated.  [facility] [phone]  Other Instructions Your symptoms are consistent with a bladder infection, also called acute cystitis. Please take your antibiotic (Macrobid) as directed until all pills are gone.  Stay very well hydrated.  Consider a daily probiotic (Align, Culturelle, or Activia) to help prevent stomach upset caused by the  antibiotic.  Taking a probiotic daily may also help prevent recurrent UTIs.  Also consider taking AZO (Phenazopyridine) tablets to help decrease pain with urination.     Urinary Tract Infection A urinary tract infection (UTI) can occur any place along the urinary tract. The tract includes the kidneys, ureters, bladder, and urethra. A type of germ called bacteria often causes a UTI. UTIs are often helped with antibiotic medicine.  HOME CARE  If given, take antibiotics as told by your doctor. Finish them even if you start to feel better. Drink enough fluids to keep your pee (urine) clear or pale yellow. Avoid tea, drinks with caffeine, and bubbly (carbonated) drinks. Pee often. Avoid holding your pee in for a long time. Pee before and after having sex (intercourse). Wipe from front to back after you poop (bowel movement) if you are a woman. Use each tissue only once. GET HELP RIGHT AWAY IF:  You have back pain. You have lower belly (abdominal) pain. You have chills. You feel sick to your stomach (nauseous). You throw up (vomit). Your burning or discomfort with peeing does not go away. You have a fever. Your symptoms are not better in 3 days. MAKE SURE YOU:  Understand these instructions. Will watch your condition. Will get help right away if you are not doing well or get worse. Document Released: 11/25/2007 Document Revised: 03/02/2012 Document Reviewed: 01/07/2012 [facility] Patient Information 2015 [place], [place]. This information is not intended to replace advice given to you by your health care provider. Make sure you discuss any questions you have with your health care  provider.    If you have been instructed to have an in-person evaluation today at a local Urgent Care facility, please use the link below. It will take you to a list of all of our available [facility], including address, phone number and hours of operation. Please do not delay care.  [facility]  If you or a family member do not have a primary care provider, use the link below to schedule a visit and establish care. When you choose a [facility] primary care physician or advanced practice provider, you gain a long-term partner in health. Find a Primary Care Provider  Learn more about [facility]'s in-office and virtual care options: [facility]

## 2022-09-25 ENCOUNTER — Encounter: Payer: Self-pay | Admitting: Family Medicine

## 2022-09-25 DIAGNOSIS — F419 Anxiety disorder, unspecified: Secondary | ICD-10-CM

## 2022-09-28 ENCOUNTER — Other Ambulatory Visit: Payer: Self-pay | Admitting: Family Medicine

## 2022-09-28 DIAGNOSIS — F419 Anxiety disorder, unspecified: Secondary | ICD-10-CM

## 2022-09-28 MED ORDER — ALPRAZOLAM 0.25 MG PO TABS: 0.2500 mg | ORAL_TABLET | Freq: Two times a day (BID) | ORAL | 1 refills | Status: DC | PRN

## 2022-09-28 NOTE — Telephone Encounter (Signed)
Requesting: alprazolam 0.25mg   Contract: 04/13/22 UDS: 04/13/22 Last Visit: 04/13/22 Next Visit: None Last Refill: 04/13/22 #30 and 1RF   Please Advise

## 2022-11-10 ENCOUNTER — Encounter: Payer: Self-pay | Admitting: Family Medicine

## 2022-11-10 ENCOUNTER — Other Ambulatory Visit: Payer: Self-pay | Admitting: Family Medicine

## 2022-11-10 DIAGNOSIS — F419 Anxiety disorder, unspecified: Secondary | ICD-10-CM

## 2022-11-10 MED ORDER — ALPRAZOLAM 0.25 MG PO TABS: 0.2500 mg | ORAL_TABLET | Freq: Two times a day (BID) | ORAL | 1 refills | Status: DC | PRN

## 2022-11-10 NOTE — Telephone Encounter (Signed)
Requesting: alprazolam 0.25mg   Contract: 04/13/22 UDS: 04/13/22 Last Visit: 04/13/22 Next Visit: None Last Refill: 09/28/22 #30 and 1rf Pt sig: 1 tab bid prn  Please Advise

## 2023-02-11 DIAGNOSIS — Z23 Encounter for immunization: Secondary | ICD-10-CM | POA: Diagnosis not present

## 2023-02-18 ENCOUNTER — Telehealth: Payer: BC Managed Care – PPO | Admitting: Family Medicine

## 2023-03-02 ENCOUNTER — Ambulatory Visit: Payer: BC Managed Care – PPO | Admitting: Family Medicine

## 2023-03-26 ENCOUNTER — Other Ambulatory Visit: Payer: Self-pay | Admitting: Family Medicine

## 2023-03-26 DIAGNOSIS — Z1212 Encounter for screening for malignant neoplasm of rectum: Secondary | ICD-10-CM

## 2023-03-26 DIAGNOSIS — Z1211 Encounter for screening for malignant neoplasm of colon: Secondary | ICD-10-CM

## 2023-05-07 ENCOUNTER — Telehealth (INDEPENDENT_AMBULATORY_CARE_PROVIDER_SITE_OTHER): Payer: BC Managed Care – PPO | Admitting: Family Medicine

## 2023-05-07 ENCOUNTER — Encounter: Payer: Self-pay | Admitting: Family Medicine

## 2023-05-07 DIAGNOSIS — N760 Acute vaginitis: Secondary | ICD-10-CM | POA: Diagnosis not present

## 2023-05-07 DIAGNOSIS — B9689 Other specified bacterial agents as the cause of diseases classified elsewhere: Secondary | ICD-10-CM | POA: Diagnosis not present

## 2023-05-07 MED ORDER — METRONIDAZOLE 500 MG PO TABS: 500.0000 mg | ORAL_TABLET | Freq: Two times a day (BID) | ORAL | 0 refills | Status: AC

## 2023-05-07 NOTE — Progress Notes (Signed)
MyChart Video Visit    Virtual Visit via Video Note   This patient is at least at moderate risk for complications without adequate follow up. This format is felt to be most appropriate for this patient at this time. Physical exam was limited by quality of the video and audio technology used for the visit. Dr Laury Axon was able to get the patient set up on a video visit.  Patient location: in car-- not driving Patient and provider in visit Provider location: Office  I discussed the limitations of evaluation and management by telemedicine and the availability of in person appointments. The patient expressed understanding and agreed to proceed.  Visit Date: 05/07/2023  Today's healthcare provider: Donato Schultz, DO     Subjective:    Patient ID: Diane Johnston, female    DOB: [birth year], 52 y.o.   MRN: [id number]  No chief complaint on file.   HPI Patient is in today for BACTERIAL VAGINITIS Discussed the use of AI scribe software for clinical note transcription with the patient, who gave verbal consent to proceed.  History of Present Illness   pt is in her car.  c/o BV symptoms .  she had the same symptoms in March of last year and was given flagyl.   no other complaints       Past Medical History:  Diagnosis Date   Anxiety    H/O: vasectomy    HUSBAND HAS HAD VASECTOMY    Past Surgical History:  Procedure Laterality Date   AUGMENTATION MAMMAPLASTY  2010   SILICONE   TONSILLECTOMY AND ADENOIDECTOMY  1980    Family History  Problem Relation Age of Onset   Osteoporosis Maternal Grandmother    Hypertension Paternal Grandmother     Social History   Socioeconomic History   Marital status: Divorced    Spouse name: Not on file   Number of children: Not on file   Years of education: Not on file   Highest education level: Not on file  Occupational History   Not on file  Tobacco Use   Smoking status: Former    Current packs/day: 0.00    Average packs/day:  (0.1 ttl pk-yrs)    Types: Cigarettes    Start date: 11/2014    Quit date: 11/2016    Years since quitting: 6.4   Smokeless tobacco: Never   Tobacco comments:    pt only ever smoked occassionally  never daily   Vaping Use   Vaping status: Never Used  Substance and Sexual Activity   Alcohol use: Yes    Alcohol/week: 1.0 standard drink of alcohol    Types: 1 Shots of liquor per week    Comment: rarely   Drug use: No   Sexual activity: Yes  Other Topics Concern   Not on file  Social History Narrative   Not on file   Social Determinants of Health   Financial Resource Strain: Not on file  Food Insecurity: Not on file  Transportation Needs: Not on file  Physical Activity: Not on file  Stress: Not on file  Social Connections: Unknown (07/03/2022)   Received from [facility], [facility]   Social Network    Social Network: Not on file  Intimate Partner Violence: Unknown (07/03/2022)   Received from [facility], [facility]   HITS    Physically Hurt: Not on file    Insult or Talk Down To: Not on file    Threaten Physical Harm: Not on  file    Scream or Curse: Not on file    Outpatient Medications Prior to Visit  Medication Sig Dispense Refill   ALPRAZolam (XANAX) 0.25 MG tablet Take 1 tablet (0.25 mg total) by mouth 2 (two) times daily as needed for anxiety. 30 tablet 1   ibuprofen (ADVIL,MOTRIN) 200 MG tablet Take 400-800 mg by mouth every 4 (four) hours as needed for fever, headache, mild pain, moderate pain or cramping.      Multiple Vitamin (MULTIVITAMIN) tablet Take 1 tablet by mouth daily.     No facility-administered medications prior to visit.    Allergies  Allergen Reactions   Penicillins Rash    Has patient had a PCN reaction causing immediate rash, facial/tongue/throat swelling, SOB or lightheadedness with hypotension: Yes Has patient had a PCN reaction causing severe rash involving mucus membranes or skin necrosis: No Has patient had a PCN reaction that  required hospitalization No Has patient had a PCN reaction occurring within the last 10 years: No If all of the above answers are "NO", then may proceed with Cephalosporin use.     Review of Systems  Constitutional:  Negative for fever and malaise/fatigue.  HENT:  Negative for congestion.   Eyes:  Negative for blurred vision.  Respiratory:  Negative for shortness of breath.   Cardiovascular:  Negative for chest pain, palpitations and leg swelling.  Gastrointestinal:  Negative for abdominal pain, blood in stool and nausea.  Genitourinary:  Negative for dysuria and frequency.       Vaginal d/c and burning with odor   Musculoskeletal:  Negative for falls.  Skin:  Negative for rash.  Neurological:  Negative for dizziness, loss of consciousness and headaches.  Endo/Heme/Allergies:  Negative for environmental allergies.  Psychiatric/Behavioral:  Negative for depression. The patient is not nervous/anxious.        Objective:    Physical Exam Constitutional:      Appearance: Normal appearance.  Pulmonary:     Effort: Pulmonary effort is normal.  Neurological:     Mental Status: She is alert.  Psychiatric:        Mood and Affect: Mood normal.        Behavior: Behavior normal.        Thought Content: Thought content normal.        Judgment: Judgment normal.     There were no vitals taken for this visit. Wt Readings from Last 3 Encounters:  04/13/22 120 lb (54.4 kg)  09/27/20 125 lb (56.7 kg)  09/15/19 122 lb 6.4 oz (55.5 kg)       Assessment & Plan:  Bacterial vaginitis Assessment & Plan: Flagyl bidx 7 days    Other orders -     metroNIDAZOLE; Take 1 tablet (500 mg total) by mouth 2 (two) times daily for 7 days.  Dispense: 14 tablet; Refill: 0   Assessment and Plan    BV-- flagyl 500 mg bid x 7 days  f/u in office if no better        I discussed the assessment and treatment plan with the patient. The patient was provided an opportunity to ask questions and all  were answered. The patient agreed with the plan and demonstrated an understanding of the instructions.   The patient was advised to call back or seek an in-person evaluation if the symptoms worsen or if the condition fails to improve as anticipated.  Donato Schultz, DO [facility] [phone] (phone) [phone] (  fax)  [facility]

## 2023-05-07 NOTE — Assessment & Plan Note (Signed)
Flagyl bidx 7 days

## 2023-05-10 ENCOUNTER — Encounter: Payer: Self-pay | Admitting: Family Medicine

## 2023-05-10 DIAGNOSIS — F419 Anxiety disorder, unspecified: Secondary | ICD-10-CM

## 2023-05-10 MED ORDER — ALPRAZOLAM 0.25 MG PO TABS: 0.2500 mg | ORAL_TABLET | Freq: Two times a day (BID) | ORAL | 1 refills | Status: DC | PRN

## 2023-05-10 NOTE — Telephone Encounter (Signed)
Requesting: Xanax Contract: 03/2022 UDS: 03/2022 Last OV: 05/07/23--v/v Next OV: n/a Last Refill: 11/10/22, #30--1 RF Database:   Please advise

## 2023-05-13 MED ORDER — ALPRAZOLAM 0.25 MG PO TABS: 0.2500 mg | ORAL_TABLET | Freq: Two times a day (BID) | ORAL | 1 refills | Status: DC | PRN

## 2023-05-13 NOTE — Telephone Encounter (Signed)
Pt out of town. Needs rx sent to different to pharmacy

## 2023-05-13 NOTE — Addendum Note (Signed)
Addended by: Roxanne Gates on: 05/13/2023 02:37 PM   Modules accepted: Orders

## 2023-07-14 ENCOUNTER — Encounter: Payer: Self-pay | Admitting: Family Medicine

## 2023-07-14 NOTE — Telephone Encounter (Signed)
Chart updated

## 2023-09-20 ENCOUNTER — Encounter: Payer: Self-pay | Admitting: Family Medicine

## 2023-09-20 DIAGNOSIS — F419 Anxiety disorder, unspecified: Secondary | ICD-10-CM

## 2023-09-20 NOTE — Telephone Encounter (Signed)
 Requesting: xanax Contract: 2023 UDS: 2023 Last OV: 05/07/23---virtual Next OV: n/a Last Refill: 05/13/23, #30--1 RF Database:   Please advise

## 2023-09-21 ENCOUNTER — Other Ambulatory Visit: Payer: Self-pay | Admitting: Family Medicine

## 2023-09-21 DIAGNOSIS — F419 Anxiety disorder, unspecified: Secondary | ICD-10-CM

## 2023-09-21 MED ORDER — ALPRAZOLAM 0.25 MG PO TABS: 0.2500 mg | ORAL_TABLET | Freq: Two times a day (BID) | ORAL | 1 refills | Status: DC | PRN

## 2023-10-06 ENCOUNTER — Ambulatory Visit: Payer: Self-pay

## 2023-10-06 NOTE — Telephone Encounter (Signed)
 Copied from CRM [id number]. Topic: Clinical - Pink Word Triage >> Oct 06, 2023 11:42 AM Adaysia C wrote: Reason for Triage: Patient feeling light headed and dizzy; CAL left message for patient to be to be nurse triaged; patient warm transferred to nurse triage   Chief Complaint: Dizziness/Light headed Symptoms: dizziness/light headedness Frequency: patient states this particular episode started yesterday Pertinent Negatives: Patient denies fever, chest pain, vomiting, diarrhea, bleeding, difficulty breathing Disposition: [] ED /[] Urgent Care (no appt availability in office) / [] Appointment(In office/virtual)/ []  [facility]/ [] Home Care/ [] Refused Recommended Disposition /[] [facility]/ []  Follow-up with PCP Additional Notes: Patient called and advised that yesterday she was driving and she felt dizzy.  She said that this happens sometimes and she will feel like she is going to pass out.  Patient had to pull over yesterday.  This happened again yesterday while patient was driving.   Patient also had an episode today. Patient states that she went to make an appointment, there were no appointments today, and she wasn't able to come in tomorrow due to work, and she states at this time she is not having any any dizziness or light headedness.  She denies any fever, pain, vomiting, diarrhea, bleeding, difficulty breathing. At this time, patient was going into [facility] for a meeting.  This RN was trying to explain to the patient that with her symptoms it is recommended that she sees a provider and is evaluated in the next 24 hours.  Patient was distracted with an incident that was going on in the parking lot.  She then told this RN that she had to go inside now and ended the call.  This RN advised her that someone would just give her a call back.   Reason for Disposition  [1] MODERATE dizziness (e.g., interferes with normal activities) AND [2] has NOT been evaluated by doctor (or  NP/PA) for this  (Exception: Dizziness caused by heat exposure, sudden standing, or poor fluid intake.)  Answer Assessment - Initial Assessment Questions 1. DESCRIPTION: "Describe your dizziness."     Light headed  2. LIGHTHEADED: "Do you feel lightheaded?" (e.g., somewhat faint, woozy, weak upon standing)     Light headed like she is going to pass out 3. VERTIGO: "Do you feel like either you or the room is spinning or tilting?" (i.e. vertigo)     No 4. SEVERITY: "How bad is it?"  "Do you feel like you are going to faint?" "Can you stand and walk?"   - MILD: Feels slightly dizzy, but walking normally.   - MODERATE: Feels unsteady when walking, but not falling; interferes with normal activities (e.g., school, work).   - SEVERE: Unable to walk without falling, or requires assistance to walk without falling; feels like passing out now.      Patient states that she does not get up and walk when it happens 5. ONSET:  "When did the dizziness begin?"     Yesterday---pt states this has happened off and on since she was in her 30s 6. AGGRAVATING FACTORS: "Does anything make it worse?" (e.g., standing, change in head position)     unknown 7. HEART RATE: "Can you tell me your heart rate?" "How many beats in 15 seconds?"  (Note: not all patients can do this)       N/a 8. CAUSE: "What do you think is causing the dizziness?"     unknown 9. RECURRENT SYMPTOM: "Have you had dizziness before?" If Yes, ask: "When was  the last time?" "What happened that time?"     Yes---different episodes 10. OTHER SYMPTOMS: "Do you have any other symptoms?" (e.g., fever, chest pain, vomiting, diarrhea, bleeding)       No 11. PREGNANCY: "Is there any chance you are pregnant?" "When was your last menstrual period?"       No  Protocols used: Dizziness - Lightheadedness-A-AH

## 2023-10-06 NOTE — Telephone Encounter (Signed)
 Copied from CRM [id number]. Topic: General - Other >> Oct 06, 2023  1:09 PM Luane Rumps D wrote: Reason for CRM: Pt calling back for missed call regarding appt opening at clinic from nurse triage.

## 2023-10-06 NOTE — Telephone Encounter (Signed)
 This RN called the CAL to advise them that while speaking to the patient and trying explain to her the disposition, the patient had to end the call.  CAL advised that they had an opening today at 1:20 pm and it would be best for this RN to call the patient back to see if she can come to this appointment and advise the patient further.

## 2023-10-06 NOTE — Telephone Encounter (Signed)
 This RN spoke with the patient and she states that she cannot come to an appointment in the office tomorrow due to appointments and work with her Production designer, theatre/television/film tomorrow.  Patient is advised that the recommendation is that she be seen in 24 hours.  She is advised that Urgent Care and the Emergency Room are there as well.  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  Patient is also advised that if anything worsens over the weekend not to hesitate to seek immediate medical attention over the weekend. Patient did state that if she got any worse over the weekend she would go to an Urgent Care or to the Emergency Room.

## 2023-10-06 NOTE — Telephone Encounter (Signed)
 This RN attempted to call the patient back and there was no answer--phone call went straight to voicemail. Left message for patient to call us  back.

## 2023-10-06 NOTE — Telephone Encounter (Signed)
 Noted.

## 2023-10-07 ENCOUNTER — Ambulatory Visit: Admitting: Family Medicine

## 2023-10-07 ENCOUNTER — Ambulatory Visit: Attending: Family Medicine

## 2023-10-07 ENCOUNTER — Encounter: Payer: Self-pay | Admitting: Family Medicine

## 2023-10-07 VITALS — BP 108/84 | HR 66 | Temp 98.3°F | Resp 18 | Ht 66.0 in | Wt 126.6 lb

## 2023-10-07 DIAGNOSIS — R55 Syncope and collapse: Secondary | ICD-10-CM

## 2023-10-07 DIAGNOSIS — F410 Panic disorder [episodic paroxysmal anxiety] without agoraphobia: Secondary | ICD-10-CM | POA: Diagnosis not present

## 2023-10-07 DIAGNOSIS — R42 Dizziness and giddiness: Secondary | ICD-10-CM

## 2023-10-07 DIAGNOSIS — R002 Palpitations: Secondary | ICD-10-CM

## 2023-10-07 DIAGNOSIS — Z79899 Other long term (current) drug therapy: Secondary | ICD-10-CM | POA: Diagnosis not present

## 2023-10-07 DIAGNOSIS — R5383 Other fatigue: Secondary | ICD-10-CM | POA: Diagnosis not present

## 2023-10-07 NOTE — Patient Instructions (Signed)
 Dizziness Dizziness is a common problem. It makes you feel unsteady or light-headed. You may feel like you're about to faint. Dizziness can lead to getting hurt if you stumble or fall. It's more common to feel dizzy if you're an older adult. Many things can cause you to feel dizzy. These include: Medicines. Dehydration. This is when there's not enough water in your body. Illness. Follow these instructions at home: Eating and drinking  Drink enough fluid to keep your pee (urine) pale yellow. This helps keep you from getting dehydrated. Try to drink more clear fluids, such as water. Do not drink alcohol. Try to limit how much caffeine you take in. Try to limit how much salt, also called sodium, you take in. Activity Try not to make quick movements. Stand up slowly from sitting in a chair. Steady yourself until you feel okay. In the morning, first sit up on the side of the bed. When you feel okay, hold onto something and slowly stand up. Do this until you know that your balance is okay. If you need to stand in one place for a long time, move your legs often. Tighten and relax the muscles in your legs while you're standing. Do not drive or use machines if you feel dizzy. Avoid bending down if you feel dizzy. Place items in your home so you can reach them without leaning over. Lifestyle Do not smoke, vape, or use products with nicotine or tobacco in them. If you need help quitting, talk with your health care provider. Try to lower your stress level. You can do this by using methods like yoga or meditation. Talk with your provider if you need help. General instructions Watch your dizziness for any changes. Take your medicines only as told by your provider. Talk with your provider if you think you're dizzy because of a medicine you're taking. Tell a friend or a family member that you're feeling dizzy. If they spot any changes in your behavior, have them call your provider. Contact a health care  provider if: Your dizziness doesn't go away, or you have new symptoms. Your dizziness gets worse. You feel like you may vomit. You have trouble hearing. You have a fever. You have neck pain or a stiff neck. You fall or get hurt. Get help right away if: You vomit each time you eat or drink. You have watery poop and can't eat or drink. You have trouble talking, walking, swallowing, or using your arms, hands, or legs. You feel very weak. You're bleeding. You're not thinking clearly, or you have trouble forming sentences. A friend or family member may spot this. Your vision changes, or you get a very bad headache. These symptoms may be an emergency. Call 911 right away. Do not wait to see if the symptoms will go away. Do not drive yourself to the hospital. This information is not intended to replace advice given to you by your health care provider. Make sure you discuss any questions you have with your health care provider. Document Revised: 03/11/2023 Document Reviewed: 07/23/2022 Elsevier Patient Education  2024 [employer].

## 2023-10-07 NOTE — Progress Notes (Unsigned)
 EP to read.

## 2023-10-07 NOTE — Progress Notes (Signed)
 Established Patient Office Visit  Subjective   Patient ID: Diane Johnston, female    DOB: 1971/03/15  Age: 53 y.o. MRN: [id number]  Chief Complaint  Patient presents with   Anxiety   Follow-up    HPI Discussed the use of AI scribe software for clinical note transcription with the patient, who gave verbal consent to proceed.  History of Present Illness A 53 year old female with a history of orthostatic hypotension presents with episodes of dizziness and near blackouts.  She has been experiencing intermittent episodes of feeling like she is going to black out. She recalls a similar episode in 2003, prior to having her daughter, where she blacked out after waking up in the middle of the night and was diagnosed with orthostatic hypotension and low blood pressure at that time.  Recently, she has been experiencing dizziness and a sensation of being 'in a fog' while driving, leading to anxiety attacks and the need to pull over. These episodes have become more frequent over the past two and a half weeks. She describes feeling disoriented and fatigued, with low energy levels, despite sleeping nine to ten hours a night.  On Tuesday, she experienced a significant episode of dizziness and anxiety while driving, which she attributes to not having eaten and consuming coffee. She also felt disoriented after returning home that evening. On Wednesday, she felt off during the day and experienced another episode while driving home from a dinner in [place].  She has a history of low B12 levels and has been feeling very fatigued recently. She took a Xanax to help with sleep due to stress and anxiety, which she rarely does.  She has not eaten today due to fasting instructions for potential lab work, but she does not feel excessively hungry. She notes that eating sometimes makes her feel sleepier.  She recently separated from her partner and moved back to [name], which has been a stressful transition.  She has two children, one of whom is on a school field trip.  No recent heavy menstrual bleeding. She feels disoriented, fatigued, and experiences anxiety attacks. No numbness in her limbs or facial asymmetry.   Patient Active Problem List   Diagnosis Date Noted   Bacterial vaginitis 05/07/2023   Preventative health care 09/27/2020   Need for hepatitis C screening test 09/27/2020   Anxiety 04/29/2018   Fatigue 01/11/2017   Sore throat 01/11/2017   Situational anxiety 11/23/2016   Encounter for IUD insertion 10/31/2012   Past Medical History:  Diagnosis Date   Anxiety    H/O: vasectomy    HUSBAND HAS HAD VASECTOMY   Past Surgical History:  Procedure Laterality Date   AUGMENTATION MAMMAPLASTY  2010   SILICONE   TONSILLECTOMY AND ADENOIDECTOMY  1980   Social History   Tobacco Use   Smoking status: Former    Current packs/day: 0.00    Average packs/day: (0.1 ttl pk-yrs)    Types: Cigarettes    Start date: 11/2014    Quit date: 11/2016    Years since quitting: 6.8   Smokeless tobacco: Never   Tobacco comments:    pt only ever smoked occassionally  never daily   Vaping Use   Vaping status: Never Used  Substance Use Topics   Alcohol use: Yes    Alcohol/week: 1.0 standard drink of alcohol    Types: 1 Shots of liquor per week    Comment: rarely   Drug use: No   Social History  Socioeconomic History   Marital status: Divorced    Spouse name: Not on file   Number of children: Not on file   Years of education: Not on file   Highest education level: Not on file  Occupational History   Not on file  Tobacco Use   Smoking status: Former    Current packs/day: 0.00    Average packs/day: (0.1 ttl pk-yrs)    Types: Cigarettes    Start date: 11/2014    Quit date: 11/2016    Years since quitting: 6.8   Smokeless tobacco: Never   Tobacco comments:    pt only ever smoked occassionally  never daily   Vaping Use   Vaping status: Never Used  Substance and Sexual  Activity   Alcohol use: Yes    Alcohol/week: 1.0 standard drink of alcohol    Types: 1 Shots of liquor per week    Comment: rarely   Drug use: No   Sexual activity: Yes  Other Topics Concern   Not on file  Social History Narrative   Not on file   Social Drivers of Health   Financial Resource Strain: Not on file  Food Insecurity: Not on file  Transportation Needs: Not on file  Physical Activity: Not on file  Stress: Not on file  Social Connections: Unknown (07/03/2022)   [facility]   Social Network    Social Network: Not on file  Intimate Partner Violence: Unknown (07/03/2022)   Received from [employer], [facility]   HITS    Physically Hurt: Not on file    Insult or Talk Down To: Not on file    Threaten Physical Harm: Not on file    Scream or Curse: Not on file   Family Status  Relation Name Status   MGM  (Not Specified)   PGM  (Not Specified)  No partnership data on file   Family History  Problem Relation Age of Onset   Osteoporosis Maternal Grandmother    Hypertension Paternal Grandmother    Allergies  Allergen Reactions   Penicillins Rash    Has patient had a PCN reaction causing immediate rash, facial/tongue/throat swelling, SOB or lightheadedness with hypotension: Yes Has patient had a PCN reaction causing severe rash involving mucus membranes or skin necrosis: No Has patient had a PCN reaction that required hospitalization No Has patient had a PCN reaction occurring within the last 10 years: No If all of the above answers are "NO", then may proceed with Cephalosporin use.       Review of Systems  Constitutional:  Negative for chills, fever and malaise/fatigue.  HENT:  Negative for congestion and hearing loss.   Eyes:  Negative for blurred vision and discharge.  Respiratory:  Negative for cough, sputum production and shortness of breath.   Cardiovascular:  Positive for palpitations. Negative for chest pain and leg  swelling.  Gastrointestinal:  Negative for abdominal pain, blood in stool, constipation, diarrhea, heartburn, nausea and vomiting.  Genitourinary:  Negative for dysuria, frequency, hematuria and urgency.  Musculoskeletal:  Negative for back pain, falls and myalgias.  Skin:  Negative for rash.  Neurological:  Positive for dizziness. Negative for sensory change, loss of consciousness, weakness and headaches.  Endo/Heme/Allergies:  Negative for environmental allergies. Does not bruise/bleed easily.  Psychiatric/Behavioral:  Negative for depression and suicidal ideas. The patient is nervous/anxious. The patient does not have insomnia.       Objective:     BP 108/84 (BP Location:  Left Arm, Patient Position: Sitting, Cuff Size: Small)   Pulse 66   Temp 98.3 F (36.8 C) (Oral)   Resp 18   Ht 5\' 6"  (1.676 m)   Wt 126 lb 9.6 oz (57.4 kg)   SpO2 97%   BMI 20.43 kg/m  BP Readings from Last 3 Encounters:  10/07/23 108/84  04/13/22 98/80  09/27/20 (!) 128/48   Wt Readings from Last 3 Encounters:  10/07/23 126 lb 9.6 oz (57.4 kg)  04/13/22 120 lb (54.4 kg)  09/27/20 125 lb (56.7 kg)   SpO2 Readings from Last 3 Encounters:  10/07/23 97%  04/13/22 97%  09/27/20 100%      Physical Exam Vitals and nursing note reviewed.  Constitutional:      General: She is not in acute distress.    Appearance: Normal appearance. She is well-developed.  HENT:     Head: Normocephalic and atraumatic.  Eyes:     General: No scleral icterus.       Right eye: No discharge.        Left eye: No discharge.  Cardiovascular:     Rate and Rhythm: Normal rate and regular rhythm.     Heart sounds: No murmur heard. Pulmonary:     Effort: Pulmonary effort is normal. No respiratory distress.     Breath sounds: Normal breath sounds.  Musculoskeletal:        General: Normal range of motion.     Cervical back: Normal range of motion and neck supple.     Right lower leg: No edema.     Left lower leg: No  edema.  Skin:    General: Skin is warm and dry.  Neurological:     Mental Status: She is alert and oriented to person, place, and time.  Psychiatric:        Mood and Affect: Mood normal.        Behavior: Behavior normal.        Thought Content: Thought content normal.        Judgment: Judgment normal.      No results found for any visits on 10/07/23.  Last CBC Lab Results  Component Value Date   WBC 6.4 04/13/2022   HGB 13.2 04/13/2022   HCT 39.7 04/13/2022   MCV 95.3 04/13/2022   MCH 30.3 12/12/2017   RDW 13.5 04/13/2022   PLT 332.0 04/13/2022   Last metabolic panel Lab Results  Component Value Date   GLUCOSE 89 04/13/2022   NA 139 04/13/2022   K 4.1 04/13/2022   CL 102 04/13/2022   CO2 30 04/13/2022   BUN 10 04/13/2022   CREATININE 0.60 04/13/2022   GFR 104.45 04/13/2022   CALCIUM 9.8 04/13/2022   PROT 6.8 04/13/2022   ALBUMIN 4.5 04/13/2022   BILITOT 0.6 04/13/2022   ALKPHOS 40 04/13/2022   AST 14 04/13/2022   ALT 8 04/13/2022   Last lipids Lab Results  Component Value Date   CHOL 203 (H) 04/13/2022   HDL 117.70 04/13/2022   LDLCALC 76 04/13/2022   TRIG 46.0 04/13/2022   CHOLHDL 2 04/13/2022   Last hemoglobin A1c No results found for: "HGBA1C" Last thyroid functions Lab Results  Component Value Date   TSH 1.46 04/13/2022   Last vitamin D Lab Results  Component Value Date   VD25OH 34.00 04/29/2018   Last vitamin B12 and Folate Lab Results  Component Value Date   VITAMINB12 565 04/13/2022      The ASCVD Risk score (Arnett  DK, et al., 2019) failed to calculate for the following reasons:   The valid HDL cholesterol range is 20 to 100 mg/dL    Assessment & Plan:   Problem List Items Addressed This Visit   None Visit Diagnoses       High risk medication use    -  Primary   Relevant Orders   Drug Monitoring Panel [id number] , Urine     Near syncope       Relevant Orders   EKG 12-Lead   LONG TERM MONITOR (3-14 DAYS)   CBC with  Differential/Platelet   Comprehensive metabolic panel with GFR   TSH   Vitamin B12   VITAMIN D 25 Hydroxy (Vit-D Deficiency, Fractures)     Severe anxiety with panic       Relevant Orders   CBC with Differential/Platelet   Comprehensive metabolic panel with GFR   TSH   Vitamin B12   VITAMIN D 25 Hydroxy (Vit-D Deficiency, Fractures)     Dizziness       Relevant Orders   LONG TERM MONITOR (3-14 DAYS)   CBC with Differential/Platelet   Comprehensive metabolic panel with GFR   TSH   Vitamin B12   VITAMIN D 25 Hydroxy (Vit-D Deficiency, Fractures)     Palpitations       Relevant Orders   LONG TERM MONITOR (3-14 DAYS)     Assessment and Plan Assessment & Plan Orthostatic Hypotension   She experiences dizziness and anxiety attacks, especially when driving, with a sensation of impending syncope. Diagnosed with orthostatic hypotension in 2003. Current blood pressure is 108/84 mmHg with a pulse of 66 bpm, slightly low but not critically so. Symptoms may be related to orthostatic hypotension, anxiety, or other underlying issues. Anxiety's contribution is considered, but other causes need to be ruled out first. Order EKG to assess heart rhythm. Instruct her to monitor blood pressure, especially during symptomatic episodes.  Anxiety   She experiences anxiety attacks, particularly when feeling dizzy or disoriented, with disorientation and fatigue over the last two and a half weeks. Anxiety may be exacerbated by stress and recent life changes, including separation from a partner and moving. Anxiety as a primary issue is acknowledged, but other medical causes need to be ruled out first. Evaluate EKG results to rule out cardiac causes. Consider anxiety management strategies after ruling out other causes.    No follow-ups on file.    Ugochi Henzler R Lowne Chase, DO

## 2023-10-08 ENCOUNTER — Encounter: Payer: Self-pay | Admitting: Family Medicine

## 2023-10-08 LAB — CBC WITH DIFFERENTIAL/PLATELET
Absolute Lymphocytes: 1455 {cells}/uL (ref 850–3900)
Absolute Monocytes: 378 {cells}/uL (ref 200–950)
Basophils Absolute: 57 {cells}/uL (ref 0–200)
Basophils Relative: 0.9 %
Eosinophils Absolute: 50 {cells}/uL (ref 15–500)
Eosinophils Relative: 0.8 %
HCT: 39.8 % (ref 35.0–45.0)
Hemoglobin: 13.1 g/dL (ref 11.7–15.5)
MCH: 31.4 pg (ref 27.0–33.0)
MCHC: 32.9 g/dL (ref 32.0–36.0)
MCV: 95.4 fL (ref 80.0–100.0)
MPV: 11.3 fL (ref 7.5–12.5)
Monocytes Relative: 6 %
Neutro Abs: 4360 {cells}/uL (ref 1500–7800)
Neutrophils Relative %: 69.2 %
Platelets: 300 10*3/uL (ref 140–400)
RBC: 4.17 10*6/uL (ref 3.80–5.10)
RDW: 12.1 % (ref 11.0–15.0)
Total Lymphocyte: 23.1 %
WBC: 6.3 10*3/uL (ref 3.8–10.8)

## 2023-10-08 LAB — COMPREHENSIVE METABOLIC PANEL WITH GFR
AG Ratio: 2.2 (calc) (ref 1.0–2.5)
ALT: 9 U/L (ref 6–29)
AST: 18 U/L (ref 10–35)
Albumin: 4.6 g/dL (ref 3.6–5.1)
Alkaline phosphatase (APISO): 49 U/L (ref 37–153)
BUN: 8 mg/dL (ref 7–25)
CO2: 27 mmol/L (ref 20–32)
Calcium: 9.9 mg/dL (ref 8.6–10.4)
Chloride: 104 mmol/L (ref 98–110)
Creat: 0.63 mg/dL (ref 0.50–1.03)
Globulin: 2.1 g/dL (ref 1.9–3.7)
Glucose, Bld: 88 mg/dL (ref 65–99)
Potassium: 3.8 mmol/L (ref 3.5–5.3)
Sodium: 140 mmol/L (ref 135–146)
Total Bilirubin: 0.6 mg/dL (ref 0.2–1.2)
Total Protein: 6.7 g/dL (ref 6.1–8.1)
eGFR: 107 mL/min/{1.73_m2} (ref >=60)

## 2023-10-08 LAB — VITAMIN B12: Vitamin B-12: 352 pg/mL (ref 200–1100)

## 2023-10-08 LAB — VITAMIN D 25 HYDROXY (VIT D DEFICIENCY, FRACTURES): Vit D, 25-Hydroxy: 44 ng/mL (ref 30–100)

## 2023-10-08 LAB — TSH: TSH: 1.11 m[IU]/L

## 2023-10-09 LAB — DRUG MONITORING PANEL 376104, URINE
Amphetamines: NEGATIVE ng/mL (ref <500)
Barbiturates: NEGATIVE ng/mL (ref <300)
Benzodiazepines: NEGATIVE ng/mL (ref <100)
Cocaine Metabolite: NEGATIVE ng/mL (ref <150)
Desmethyltramadol: NEGATIVE ng/mL (ref <100)
Opiates: NEGATIVE ng/mL (ref <100)
Oxycodone: NEGATIVE ng/mL (ref <100)
Tramadol: NEGATIVE ng/mL (ref <100)

## 2023-10-09 LAB — DM TEMPLATE

## 2023-10-11 ENCOUNTER — Ambulatory Visit: Admitting: Family Medicine

## 2023-11-17 DIAGNOSIS — F4322 Adjustment disorder with anxiety: Secondary | ICD-10-CM | POA: Diagnosis not present

## 2023-11-24 DIAGNOSIS — F4322 Adjustment disorder with anxiety: Secondary | ICD-10-CM | POA: Diagnosis not present

## 2024-01-06 ENCOUNTER — Encounter: Payer: Self-pay | Admitting: Family Medicine

## 2024-01-06 ENCOUNTER — Telehealth: Payer: Self-pay | Admitting: Family Medicine

## 2024-01-06 DIAGNOSIS — F419 Anxiety disorder, unspecified: Secondary | ICD-10-CM

## 2024-01-06 NOTE — Telephone Encounter (Signed)
 Requesting: alprazolam  0.25mg   Contract: 10/07/23 UDS: 10/07/23 Last Visit: 10/07/23 Next Visit: None Last Refill: 09/21/23 #30 and 1rf   Please Advise

## 2024-01-07 NOTE — Telephone Encounter (Signed)
 PDMP okay, Rx sent

## 2024-02-12 ENCOUNTER — Encounter: Payer: Self-pay | Admitting: Family Medicine

## 2024-02-12 DIAGNOSIS — F419 Anxiety disorder, unspecified: Secondary | ICD-10-CM

## 2024-02-14 MED ORDER — ALPRAZOLAM 0.25 MG PO TABS
0.2500 mg | ORAL_TABLET | Freq: Two times a day (BID) | ORAL | 0 refills | Status: DC | PRN
Start: 2024-02-14 — End: 2024-04-28

## 2024-02-14 NOTE — Telephone Encounter (Signed)
 Requesting: Xanax  Contract: 10/07/2023 UDS: 10/07/2023 Last OV: 10/07/2023 Next OV: n/a Last Refill: 01/07/2024, #30--0 RF Database:   Please advise

## 2024-03-13 ENCOUNTER — Telehealth (INDEPENDENT_AMBULATORY_CARE_PROVIDER_SITE_OTHER): Admitting: Family Medicine

## 2024-03-13 DIAGNOSIS — L989 Disorder of the skin and subcutaneous tissue, unspecified: Secondary | ICD-10-CM

## 2024-03-13 NOTE — Progress Notes (Signed)
 MyChart Video Visit    Virtual Visit via Video Note   This patient is at least at moderate risk for complications without adequate follow up. This format is felt to be most appropriate for this patient at this time. Physical exam was limited by quality of the video and audio technology used for the visit. heather was able to get the patient set up on a video visit.  Patient location: home Patient and provider in visit Provider location: Office  I discussed the limitations of evaluation and management by telemedicine and the availability of in person appointments. The patient expressed understanding and agreed to proceed.  Visit Date: 03/13/2024  Today's healthcare provider: [name] Antonio Cyndee, DO     Subjective:    Patient ID: Diane Johnston, female    DOB: February 17, 1971, 53 y.o.   MRN: [id number]  Chief Complaint  Patient presents with   foot concern    HPI  Pt presents with lesion on ball L foot.  Its been there for several months but recently got bigger   no pain Requesting referral     Past Medical History:  Diagnosis Date   Anxiety     Past Surgical History:  Procedure Laterality Date   AUGMENTATION MAMMAPLASTY  2010   SILICONE   TONSILLECTOMY AND ADENOIDECTOMY  1980    Family History  Problem Relation Age of Onset   Osteoporosis Maternal Grandmother    Hypertension Paternal Grandmother     Social History   Socioeconomic History   Marital status: Divorced    Spouse name: Not on file   Number of children: Not on file   Years of education: Not on file   Highest education level: Not on file  Occupational History   Not on file  Tobacco Use   Smoking status: Former    Current packs/day: 0.00    Average packs/day: (0.1 ttl pk-yrs)    Types: Cigarettes    Start date: 11/2014    Quit date: 11/2016    Years since quitting: 7.3   Smokeless tobacco: Never   Tobacco comments:    pt only ever smoked occassionally  never daily   Vaping Use   Vaping  status: Never Used  Substance and Sexual Activity   Alcohol use: Yes    Alcohol/week: 1.0 standard drink of alcohol    Types: 1 Shots of liquor per week    Comment: rarely   Drug use: No   Sexual activity: Yes  Other Topics Concern   Not on file  Social History Narrative   Not on file   Social Drivers of Health   Financial Resource Strain: Not on file  Food Insecurity: Not on file  Transportation Needs: Not on file  Physical Activity: Not on file  Stress: Not on file  Social Connections: Unknown (07/03/2022)   Received from [facility]   Social Network    Social Network: Not on file  Intimate Partner Violence: Unknown (07/03/2022)   Received from [facility]   HITS    Physically Hurt: Not on file    Insult or Talk Down To: Not on file    Threaten Physical Harm: Not on file    Scream or Curse: Not on file    Outpatient Medications Prior to Visit  Medication Sig Dispense Refill   ALPRAZolam  (XANAX ) 0.25 MG tablet Take 1 tablet (0.25 mg total) by mouth 2 (two) times daily as needed for anxiety. 30 tablet 0   ibuprofen  (ADVIL ,MOTRIN )  200 MG tablet Take 400-800 mg by mouth every 4 (four) hours as needed for fever, headache, mild pain, moderate pain or cramping.      Multiple Vitamin (MULTIVITAMIN) tablet Take 1 tablet by mouth daily.     No facility-administered medications prior to visit.    Allergies  Allergen Reactions   Penicillins Rash    Has patient had a PCN reaction causing immediate rash, facial/tongue/throat swelling, SOB or lightheadedness with hypotension: Yes Has patient had a PCN reaction causing severe rash involving mucus membranes or skin necrosis: No Has patient had a PCN reaction that required hospitalization No Has patient had a PCN reaction occurring within the last 10 years: No If all of the above answers are NO, then may proceed with Cephalosporin use.     Review of Systems  Constitutional:  Negative for fever.  HENT:  Negative for  congestion.   Eyes:  Negative for blurred vision.  Respiratory:  Negative for cough.   Cardiovascular:  Negative for chest pain and palpitations.  Gastrointestinal:  Negative for vomiting.  Musculoskeletal:  Negative for back pain.  Skin:  Negative for rash.  Neurological:  Negative for loss of consciousness and headaches.       Objective:    Physical Exam Vitals and nursing note reviewed.  Constitutional:      General: She is not in acute distress. Skin:    Comments: L foot -- ball of foot --- +erythema about 1 in diam with center having dark spot almost like a bruise ---  no pain with palpation   Neurological:     Mental Status: She is alert.     There were no vitals taken for this visit. Wt Readings from Last 3 Encounters:  10/07/23 126 lb 9.6 oz (57.4 kg)  04/13/22 120 lb (54.4 kg)  09/27/20 125 lb (56.7 kg)       Assessment & Plan:  Foot lesion -     Ambulatory referral to Podiatry   Referral to podiatry-- call or return to office prm   I discussed the assessment and treatment plan with the patient. The patient was provided an opportunity to ask questions and all were answered. The patient agreed with the plan and demonstrated an understanding of the instructions.   The patient was advised to call back or seek an in-person evaluation if the symptoms worsen or if the condition fails to improve as anticipated.  [name] Antonio Cyndee, DO [facility] [phone] (phone) [phone] (fax)  [facility]

## 2024-04-24 ENCOUNTER — Encounter: Payer: Self-pay | Admitting: Family Medicine

## 2024-04-24 DIAGNOSIS — F419 Anxiety disorder, unspecified: Secondary | ICD-10-CM

## 2024-04-28 ENCOUNTER — Other Ambulatory Visit: Payer: Self-pay | Admitting: Family Medicine

## 2024-04-28 DIAGNOSIS — F419 Anxiety disorder, unspecified: Secondary | ICD-10-CM

## 2024-04-28 MED ORDER — ALPRAZOLAM 0.25 MG PO TABS: 0.2500 mg | ORAL_TABLET | Freq: Two times a day (BID) | ORAL | 0 refills | Status: AC | PRN

## 2024-04-28 NOTE — Telephone Encounter (Signed)
 Requesting: alprazolam  0.25mg  Contract: 10/07/23 UDS: 10/07/23 Last Visit: 03/13/24 Next Visit: None Last Refill: 02/14/24 #30 and 0RF   Please Advise

## 2024-05-01 ENCOUNTER — Ambulatory Visit

## 2024-05-01 ENCOUNTER — Ambulatory Visit: Admitting: Podiatry

## 2024-05-01 VITALS — Ht 66.0 in | Wt 126.6 lb

## 2024-05-01 DIAGNOSIS — M7751 Other enthesopathy of right foot: Secondary | ICD-10-CM

## 2024-05-01 DIAGNOSIS — R2241 Localized swelling, mass and lump, right lower limb: Secondary | ICD-10-CM | POA: Diagnosis not present

## 2024-05-01 NOTE — Progress Notes (Unsigned)
   Chief Complaint  Patient presents with   Wound Check    Pt is here due to possible blister to the right foot, discolored, has been like this for a year, has no pain, she is just concern. No meds for pain.    HPI: 53 y.o. female presenting today as a new patient for evaluation of an enlarging lesion to the plantar medial aspect of the the right forefoot.  Currently asymptomatic but it has enlarged slowly over the past year.  She has not done anything for treatment  Past Medical History:  Diagnosis Date   Anxiety     Past Surgical History:  Procedure Laterality Date   AUGMENTATION MAMMAPLASTY  2010   SILICONE   TONSILLECTOMY AND ADENOIDECTOMY  1980    Allergies  Allergen Reactions   Penicillins Rash    Has patient had a PCN reaction causing immediate rash, facial/tongue/throat swelling, SOB or lightheadedness with hypotension: Yes Has patient had a PCN reaction causing severe rash involving mucus membranes or skin necrosis: No Has patient had a PCN reaction that required hospitalization No Has patient had a PCN reaction occurring within the last 10 years: No If all of the above answers are NO, then may proceed with Cephalosporin use.       Physical Exam: General: The patient is alert and oriented x3 in no acute distress.  Dermatology: Skin is warm, dry and supple bilateral lower extremities.   Vascular: Palpable pedal pulses bilaterally. Capillary refill within normal limits.  No appreciable edema.  No erythema.  Neurological: Grossly intact via light touch  Musculoskeletal Exam: No pedal deformities noted.  Soft tissue mass noted to the plantar medial aspect of the first MTP right foot.  It is a somewhat hard nodule with purple discoloration deep to the epidermal layers.  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 Joint spaces preserved.  No fractures or osseous irregularities noted.  Assessment/Plan of  Care: 1.  Soft tissue skin lesion plantar aspect of the right foot  -Patient evaluated.  X-rays reviewed -Although it is asymptomatic currently, the soft tissue lesion appears somewhat abnormal and it has grown in size over the past year.  I do believe that surgical excisional biopsy of the lesion is warranted.  Prior to this recommend MRI w contrast -Order placed MRI w contrast RT foot -Today we discussed surgical excision including the risk benefits advantages and disadvantages of the procedure.  Postoperative recovery course was also explained.  No guarantees were expressed or implied.  Patient consents for surgery to remove the enlarging soft tissue mass -Authorization for surgery was initiated today.  Surgery will consist of excisional biopsy soft tissue mass right foot -MRI will need to be completed prior to surgery to assist with surgical planning -Return to clinic 1 week postop  *Oncology rep     Thresa EMERSON Sar, DPM [facility]  Dr. Thresa EMERSON Sar, DPM    [address] [place], [place] [address]                Office [phone]  Fax [phone]

## 2024-05-17 ENCOUNTER — Telehealth: Payer: Self-pay

## 2024-05-17 NOTE — Telephone Encounter (Signed)
 PA for MRI is pending. A P2P needs to be done by contacting [employer] at [phone] and referencing case# [id number] before 05/25/24 at 12 noon. Patient is scheduled for MRI 05/26/24.

## 2024-05-26 ENCOUNTER — Other Ambulatory Visit

## 2024-05-30 ENCOUNTER — Inpatient Hospital Stay: Admission: RE | Admit: 2024-05-30 | Discharge: 2024-05-30 | Attending: Podiatry

## 2024-05-30 DIAGNOSIS — R2241 Localized swelling, mass and lump, right lower limb: Secondary | ICD-10-CM | POA: Diagnosis not present

## 2024-05-30 DIAGNOSIS — M19041 Primary osteoarthritis, right hand: Secondary | ICD-10-CM | POA: Diagnosis not present

## 2024-05-30 MED ORDER — GADOPICLENOL 0.5 MMOL/ML IV SOLN
6.0000 mL | Freq: Once | INTRAVENOUS | Status: AC | PRN
  Administered 2024-05-30: 6 mL via INTRAVENOUS

## 2024-06-19 ENCOUNTER — Other Ambulatory Visit: Payer: Self-pay | Admitting: Family Medicine

## 2024-06-19 MED ORDER — METRONIDAZOLE 500 MG PO TABS: 500.0000 mg | ORAL_TABLET | Freq: Two times a day (BID) | ORAL | 0 refills | Status: AC

## 2024-06-29 ENCOUNTER — Telehealth: Payer: Self-pay | Admitting: Podiatry

## 2024-06-29 NOTE — Telephone Encounter (Signed)
 Patient called wanting to know what the next steps are. Should she schedule an appointment to go over MRI results?

## 2024-07-12 ENCOUNTER — Encounter: Payer: Self-pay | Admitting: Podiatry

## 2024-07-12 ENCOUNTER — Telehealth: Payer: Self-pay | Admitting: Podiatry

## 2024-07-12 ENCOUNTER — Ambulatory Visit: Admitting: Podiatry

## 2024-07-12 VITALS — Ht 66.0 in | Wt 126.6 lb

## 2024-07-12 DIAGNOSIS — M7989 Other specified soft tissue disorders: Secondary | ICD-10-CM

## 2024-07-12 NOTE — Telephone Encounter (Signed)
 Called and left message for patient to contact office to schedule surgery.

## 2024-07-12 NOTE — Progress Notes (Signed)
 "  Chief Complaint  Patient presents with   Wound Check    Pt is here to discuss MRI result on right foot.    HPI: 54 y.o. female presenting today for follow-up evaluation of an enlarging lesion to the plantar medial aspect of the the right forefoot. asymptomatic but it has enlarged slowly over the past year.  MRI ordered last visit.  She has not done anything for treatment  Past Medical History:  Diagnosis Date   Anxiety     Past Surgical History:  Procedure Laterality Date   AUGMENTATION MAMMAPLASTY  2010   SILICONE   TONSILLECTOMY AND ADENOIDECTOMY  1980    Allergies  Allergen Reactions   Penicillins Rash    Has patient had a PCN reaction causing immediate rash, facial/tongue/throat swelling, SOB or lightheadedness with hypotension: Yes Has patient had a PCN reaction causing severe rash involving mucus membranes or skin necrosis: No Has patient had a PCN reaction that required hospitalization No Has patient had a PCN reaction occurring within the last 10 years: No If all of the above answers are NO, then may proceed with Cephalosporin use.       Physical Exam: General: The patient is alert and oriented x3 in no acute distress.  Dermatology: Skin is warm, dry and supple bilateral lower extremities.   Vascular: Palpable pedal pulses bilaterally. Capillary refill within normal limits.  No appreciable edema.  No erythema.  Neurological: Grossly intact via light touch  Musculoskeletal Exam: Unchanged.  No pedal deformities noted.  Soft tissue mass noted to the plantar medial aspect of the first MTP right foot.  It is a somewhat hard nodule with purple discoloration deep to the epidermal layers.  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 Joint spaces preserved.  No fractures or osseous irregularities noted.  MR FOOT RIGHT W WO CONTRAST (Accession [id number]) (Order [id number]) Imaging Date: 05/30/2024 Department:  [facility] AT [address] Released By: Brien Barnie PARAS Authorizing: Janit Thresa HERO, DPM  IMPRESSION: 1. A 1.3 x 0.8 x 1.5 cm multiloculated cystic mass along the plantar aspect of the foot first metatarsal neck within the subcutaneous fat concerning for a ganglion cyst. 2. 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 3. Moderate arthritic changes of the medial hallux sesamoid-metatarsal articulation with subchondral marrow edema. Mild arthritic changes of the lateral hallux sesamoid-metatarsal articulation.  Assessment/Plan of Care: 1.  Soft tissue mass plantar medial aspect of the right foot  -Patient evaluated.  MRI reviewed in detail with the patient -Although it is asymptomatic currently, the soft tissue mass appears somewhat abnormal and it has grown in size over the past year.   -Order placed MRI w contrast RT foot -Due to the abnormal nature of the lesion I do recommend removing the lesion surgically and sending to pathology to ensure there is no malignant tissue or abnormality other than a simple ganglion cyst.  This was discussed in detail with the patient.  She is amenable to this plan -Authorization for surgery was initiated last visit on 05/01/2024.  Surgery will consist of excisional biopsy soft tissue mass right foot -MRI will need to be completed prior to surgery to assist with surgical planning -Return to clinic 1 week postop  *Oncology rep     Thresa EMERSON Janit, DPM [facility]  Dr. Thresa EMERSON Janit, DPM    [address]  [place], [place] [address]                Office [phone]  Fax [phone]   "

## 2024-07-20 NOTE — Telephone Encounter (Signed)
 Patient called back and is scheduled for surgery on 08/03/2024. Patient not on any GLP1 or blood thinners. Patient pharmacy correct in chart.

## 2024-07-27 ENCOUNTER — Telehealth: Payer: Self-pay | Admitting: Podiatry

## 2024-07-27 NOTE — Telephone Encounter (Signed)
 DOS- 08/03/2024  EXC GANGLION/TUMOR RT- 71909  [employer] EFFECTIVE DATE- 06/22/2024  DEDUCTIBLE- $250 REMAINING- $250 OOP- $2250 REMAINING- $2225 COINSURANCE- 0%  PER AVAILITY PORTAL, PRIOR AUTH IS NOT REQUIRED FOR CPT CODE 71909.

## 2024-08-09 ENCOUNTER — Encounter: Admitting: Podiatry

## 2024-08-23 ENCOUNTER — Encounter: Admitting: Podiatry

## 2024-09-06 ENCOUNTER — Encounter: Admitting: Podiatry
